# Patient Record
Sex: Female | Born: 1990 | Race: Black or African American | Hispanic: No | Marital: Single | Smoking: Never smoker
Health system: Southern US, Community
[De-identification: ages and names within clinical notes are randomized; demographics above are authoritative.]

---
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
## 2012-01-23 ENCOUNTER — Emergency Department (HOSPITAL_COMMUNITY)
Admission: EM | Admit: 2012-01-23 | Discharge: 2012-01-23 | Disposition: A | Discharge status: Home / Self Care | Payer: Medicaid Other | Attending: Emergency Medicine | Admitting: Emergency Medicine

## 2012-01-23 ENCOUNTER — Encounter (HOSPITAL_COMMUNITY): Payer: Self-pay | Admitting: *Deleted

## 2012-01-23 DIAGNOSIS — M791 Myalgia, unspecified site: Secondary | ICD-10-CM

## 2012-01-23 DIAGNOSIS — R51 Headache: Secondary | ICD-10-CM | POA: Insufficient documentation

## 2012-01-23 DIAGNOSIS — M542 Cervicalgia: Secondary | ICD-10-CM | POA: Insufficient documentation

## 2012-01-23 DIAGNOSIS — IMO0001 Reserved for inherently not codable concepts without codable children: Secondary | ICD-10-CM | POA: Insufficient documentation

## 2012-01-23 MED ORDER — IBUPROFEN 800 MG PO TABS: 800.0000 mg | ORAL_TABLET | Freq: Three times a day (TID) | ORAL | Status: AC | PRN

## 2012-01-23 MED ORDER — DIAZEPAM 5 MG PO TABS: 5.0000 mg | ORAL_TABLET | Freq: Three times a day (TID) | ORAL | Status: AC | PRN

## 2012-01-23 MED ORDER — HYDROCODONE-ACETAMINOPHEN 5-325 MG PO TABS: 1.0000 | ORAL_TABLET | Freq: Four times a day (QID) | ORAL | Status: AC | PRN
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
## 2012-01-24 NOTE — ED Provider Notes (Signed)
Medical screening examination/treatment/procedure(s) were performed by non-physician practitioner and as supervising physician I was immediately available for consultation/collaboration.  Nicholes Stairs, MD 01/24/12 [phone]

## 2016-07-10 ENCOUNTER — Encounter (HOSPITAL_COMMUNITY): Payer: Self-pay | Admitting: Emergency Medicine

## 2016-07-10 ENCOUNTER — Emergency Department (HOSPITAL_COMMUNITY)
Admission: EM | Admit: 2016-07-10 | Discharge: 2016-07-10 | Disposition: A | Discharge status: Home / Self Care | Payer: No Typology Code available for payment source | Attending: Emergency Medicine | Admitting: Emergency Medicine

## 2016-07-10 DIAGNOSIS — Y92410 Unspecified street and highway as the place of occurrence of the external cause: Secondary | ICD-10-CM | POA: Insufficient documentation

## 2016-07-10 DIAGNOSIS — M25512 Pain in left shoulder: Secondary | ICD-10-CM | POA: Diagnosis present

## 2016-07-10 DIAGNOSIS — T148XXA Other injury of unspecified body region, initial encounter: Secondary | ICD-10-CM

## 2016-07-10 DIAGNOSIS — Y999 Unspecified external cause status: Secondary | ICD-10-CM | POA: Insufficient documentation

## 2016-07-10 DIAGNOSIS — R51 Headache: Secondary | ICD-10-CM | POA: Insufficient documentation

## 2016-07-10 DIAGNOSIS — Y9389 Activity, other specified: Secondary | ICD-10-CM | POA: Diagnosis not present

## 2016-07-10 DIAGNOSIS — S39012A Strain of muscle, fascia and tendon of lower back, initial encounter: Secondary | ICD-10-CM | POA: Diagnosis not present

## 2016-07-10 MED ORDER — NAPROXEN 500 MG PO TABS: 500.0000 mg | ORAL_TABLET | Freq: Two times a day (BID) | ORAL | 0 refills | Status: DC

## 2016-07-10 MED ORDER — NAPROXEN 500 MG PO TABS
500.0000 mg | ORAL_TABLET | Freq: Once | ORAL | Status: AC
  Administered 2016-07-10: 500 mg via ORAL
  Filled 2016-07-10: qty 1

## 2016-07-10 MED ORDER — METHOCARBAMOL 500 MG PO TABS: 500.0000 mg | ORAL_TABLET | Freq: Four times a day (QID) | ORAL | 0 refills | Status: DC
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
## 2016-10-11 ENCOUNTER — Encounter (HOSPITAL_COMMUNITY): Payer: Self-pay

## 2016-10-11 ENCOUNTER — Emergency Department (HOSPITAL_COMMUNITY)
Admission: EM | Admit: 2016-10-11 | Discharge: 2016-10-12 | Disposition: A | Discharge status: Home / Self Care | Payer: Self-pay | Attending: Emergency Medicine | Admitting: Emergency Medicine

## 2016-10-11 DIAGNOSIS — R112 Nausea with vomiting, unspecified: Secondary | ICD-10-CM | POA: Insufficient documentation

## 2016-10-11 DIAGNOSIS — R1030 Lower abdominal pain, unspecified: Secondary | ICD-10-CM

## 2016-10-11 LAB — URINE MICROSCOPIC-ADD ON: RBC / HPF: NONE SEEN RBC/hpf (ref 0–5)

## 2016-10-11 LAB — URINALYSIS, ROUTINE W REFLEX MICROSCOPIC
Bilirubin Urine: NEGATIVE
GLUCOSE, UA: NEGATIVE mg/dL
Hgb urine dipstick: NEGATIVE
Ketones, ur: NEGATIVE mg/dL
Nitrite: NEGATIVE
PH: 7 (ref 5.0–8.0)
PROTEIN: NEGATIVE mg/dL
SPECIFIC GRAVITY, URINE: 1.027 (ref 1.005–1.030)

## 2016-10-11 LAB — POC URINE PREG, ED: Preg Test, Ur: NEGATIVE

## 2016-10-11 NOTE — ED Notes (Signed)
Patient giving urine sample at this time 

## 2016-10-11 NOTE — ED Provider Notes (Signed)
[facility]-EMERGENCY DEPT Provider Note   CSN: [id number] Arrival date & time: 10/11/16  2056   By signing my name below, I, [name], attest that this documentation has been prepared under the direction and in the presence of [name], PA-C. Electronically Signed: [name], ED Scribe. 10/11/2016. 11:29 PM.   History   Chief Complaint Chief Complaint  Patient presents with  . Flank Pain    bilateral  . Emesis   The history is provided by the patient. No language interpreter was used.   HPI Comments:  Colleen Keller is a 25 y.o. female who presents to the Emergency Department complaining of constant aching pain with intermittent sharp pain b/l in low back pain with radiation into lower abdomen x 5 hours. Pt complains of associated nausea, vomiting (4 episodes), and yellow vaginal discharge. She denies fever, diarrhea, dysuria, hematuria, vaginal bleeding, vaginal pain, pelvic pain, bowel/bladder incontinence, saddle anesthesia, numbness, or weakness. No alleviating factors noted. Pt is sexually active with 1 female partner, and always uses a condom. Patient does report she has had the same pain on and off for years. She reports she has had US performed that have not shown any abnormalities. She has tried OCP, implanon, and most recently had an IUD placed approximately 2 months ago to help with symptoms. Pt denies history of IV drug use. No h/o cancer.    History reviewed. No pertinent past medical history.  Patient Active Problem List   Diagnosis Date Noted  . OBESITY, UNSPECIFIED 10/12/2008  . CONTACT DERMATITIS&OTHER ECZEMA DUE UNSPEC CAUSE 10/12/2008  . RASH AND OTHER NONSPECIFIC SKIN ERUPTION 09/03/2008  . RHINITIS, ALLERGIC NOS 04/18/2007  . ACNE VULGARIS, FACIAL 04/18/2007    History reviewed. No pertinent surgical history.  OB History    No data available       Home Medications    Prior to Admission medications   Medication Sig Start Date End Date  Taking? Authorizing Provider  doxycycline (VIBRAMYCIN) 100 MG capsule Take 1 capsule (100 mg total) by mouth 2 (two) times daily. 10/12/16 10/26/16  Lona Kettle, PA-C  methocarbamol (ROBAXIN) 500 MG tablet Take 1 tablet (500 mg total) by mouth 4 (four) times daily. Patient not taking: Reported on 10/11/2016 07/10/16   [name], PA-C  naproxen (NAPROSYN) 500 MG tablet Take 1 tablet (500 mg total) by mouth 2 (two) times daily. Patient not taking: Reported on 10/11/2016 07/10/16   [name], PA-C  ondansetron (ZOFRAN ODT) 4 MG disintegrating tablet Take 1 tablet (4 mg total) by mouth every 8 (eight) hours as needed for nausea or vomiting. 10/12/16   Lona Kettle, PA-C    Family History History reviewed. No pertinent family history.  Social History Social History  Substance Use Topics  . Smoking status: Never Smoker  . Smokeless tobacco: Never Used  . Alcohol use Yes     Allergies   Patient has no known allergies.   Review of Systems Review of Systems  Constitutional: Negative for fever.  HENT: Positive for congestion and sore throat.   Eyes: Negative for visual disturbance.  Respiratory: Positive for cough. Negative for shortness of breath.   Cardiovascular: Negative for chest pain.  Gastrointestinal: Positive for abdominal pain ( lower), nausea and vomiting. Negative for diarrhea.  Genitourinary: Positive for vaginal discharge. Negative for dysuria, pelvic pain and vaginal pain.  Musculoskeletal: Positive for back pain.  Skin: Positive for rash.  Neurological: Negative for weakness and numbness.  Physical Exam Updated Vital Signs BP 129/82 (BP Location: Right Arm)   Pulse 72   Temp 98.8 F (37.1 C) (Oral)   Resp 16   Wt 122.5 kg   SpO2 99%   Physical Exam  Constitutional: She appears well-developed and well-nourished. No distress.  HENT:  Head: Normocephalic and atraumatic.  Mouth/Throat: Oropharynx is clear and moist. No oropharyngeal exudate.    Eyes: Conjunctivae and EOM are normal. Right eye exhibits no discharge. Left eye exhibits no discharge. No scleral icterus.  Neck: Normal range of motion. Neck supple.  Cardiovascular: Normal rate, regular rhythm, normal heart sounds and intact distal pulses.   No murmur heard. Pulmonary/Chest: Effort normal and breath sounds normal. No respiratory distress. She has no wheezes. She has no rales. She exhibits no tenderness.  Abdominal: Soft. She exhibits no distension and no mass. There is tenderness in the suprapubic area. There is guarding. There is no rebound. No hernia.  TTP in suprapubic region with mild guarding.   Genitourinary: Vagina normal. Pelvic exam was performed with patient supine. Cervix exhibits motion tenderness and discharge.  Genitourinary Comments: Chaperone present for duration of exam. External anatomy normal - no injury, lesions, masses, or rashes. No bleeding, lesions, masses, or ulcerations in vaginal cavity. Cervix is closed with off white discharge. IUD strings visualized. No friability. Mild CMT and TTP on bimanual exam, no masses palpated.    Musculoskeletal: She exhibits no edema.  Bilateral CVA tenderness. Tender to lumbar spine.   Neurological: She is alert. She has normal strength. She is not disoriented. No sensory deficit. She exhibits normal muscle tone. Coordination normal. GCS eye subscore is 4. GCS verbal subscore is 5. GCS motor subscore is 6.  Skin: Skin is warm and dry. Rash noted. She is not diaphoretic.  Raised vesicular rash  Psychiatric: She has a normal mood and affect. Her behavior is normal.  Nursing note and vitals reviewed.    ED Treatments / Results  DIAGNOSTIC STUDIES:  Oxygen Saturation is 100% on RA, normal by my interpretation.    COORDINATION OF CARE:  11:29 PM Discussed treatment plan with pt at bedside and pt agreed to plan.   Labs (all labs ordered are listed, but only abnormal results are displayed) Labs Reviewed  WET  PREP, GENITAL - Abnormal; Notable for the following:       Result Value   WBC, Wet Prep HPF POC MODERATE (*)    All other components within normal limits  URINALYSIS, ROUTINE W REFLEX MICROSCOPIC (NOT AT [facility]) - Abnormal; Notable for the following:    APPearance CLOUDY (*)    Leukocytes, UA SMALL (*)    All other components within normal limits  URINE MICROSCOPIC-ADD ON - Abnormal; Notable for the following:    Squamous Epithelial / LPF 6-30 (*)    Bacteria, UA FEW (*)    All other components within normal limits  COMPREHENSIVE METABOLIC PANEL - Abnormal; Notable for the following:    Glucose, Bld 100 (*)    ALT 13 (*)    All other components within normal limits  CBC WITH DIFFERENTIAL/PLATELET - Abnormal; Notable for the following:    Hemoglobin 10.7 (*)    HCT 34.4 (*)    All other components within normal limits  LIPASE, BLOOD  POC URINE PREG, ED  GC/CHLAMYDIA PROBE AMP ([place]) NOT AT [facility]    EKG  EKG Interpretation None       Radiology [place] Transvaginal Non-ob  Result Date: 10/12/2016 CLINICAL DATA:  Lower abdominal pain, nausea and vomiting since last night. EXAM: TRANSABDOMINAL AND TRANSVAGINAL ULTRASOUND OF PELVIS DOPPLER ULTRASOUND OF OVARIES TECHNIQUE: Both transabdominal and transvaginal ultrasound examinations of the pelvis were performed. Transabdominal technique was performed for global imaging of the pelvis including uterus, ovaries, adnexal regions, and pelvic cul-de-sac. It was necessary to proceed with endovaginal exam following the transabdominal exam to visualize the ovaries and endometrium. Color and duplex Doppler ultrasound was utilized to evaluate blood flow to the ovaries. COMPARISON:  None. FINDINGS: Uterus Measurements: 9.1 x 4.3 x 5.2 cm. No fibroids or other mass visualized. Endometrium Thickness: 7.5 mm. Echogenic stripe consistent with intrauterine device demonstrated in the lower uterine segment and extending towards the endocervical region. Right  ovary Measurements: 3.7 x 2.2 x 2.4 cm. Normal appearance/no adnexal mass. Left ovary Measurements: 5.4 x 2.9 x 3.9 cm. Normal appearance/no adnexal mass. Pulsed Doppler evaluation of both ovaries demonstrates normal low-resistance arterial and venous waveforms. Other findings Small amount of free fluid in the pelvis. IMPRESSION: Intrauterine device is positioned low in the lower uterine segment extending towards the cervix. Examination is otherwise unremarkable. Electronically Signed   By: Burman Nieves M.D.   On: 10/12/2016 04:18   US Pelvis Complete  Result Date: 10/12/2016 CLINICAL DATA:  Lower abdominal pain, nausea and vomiting since last night. EXAM: TRANSABDOMINAL AND TRANSVAGINAL ULTRASOUND OF PELVIS DOPPLER ULTRASOUND OF OVARIES TECHNIQUE: Both transabdominal and transvaginal ultrasound examinations of the pelvis were performed. Transabdominal technique was performed for global imaging of the pelvis including uterus, ovaries, adnexal regions, and pelvic cul-de-sac. It was necessary to proceed with endovaginal exam following the transabdominal exam to visualize the ovaries and endometrium. Color and duplex Doppler ultrasound was utilized to evaluate blood flow to the ovaries. COMPARISON:  None. FINDINGS: Uterus Measurements: 9.1 x 4.3 x 5.2 cm. No fibroids or other mass visualized. Endometrium Thickness: 7.5 mm. Echogenic stripe consistent with intrauterine device demonstrated in the lower uterine segment and extending towards the endocervical region. Right ovary Measurements: 3.7 x 2.2 x 2.4 cm. Normal appearance/no adnexal mass. Left ovary Measurements: 5.4 x 2.9 x 3.9 cm. Normal appearance/no adnexal mass. Pulsed Doppler evaluation of both ovaries demonstrates normal low-resistance arterial and venous waveforms. Other findings Small amount of free fluid in the pelvis. IMPRESSION: Intrauterine device is positioned low in the lower uterine segment extending towards the cervix. Examination is  otherwise unremarkable. Electronically Signed   By: Burman Nieves M.D.   On: 10/12/2016 04:18   [place] Art/ven Flow Abd Pelv Doppler  Result Date: 10/12/2016 CLINICAL DATA:  Lower abdominal pain, nausea and vomiting since last night. EXAM: TRANSABDOMINAL AND TRANSVAGINAL ULTRASOUND OF PELVIS DOPPLER ULTRASOUND OF OVARIES TECHNIQUE: Both transabdominal and transvaginal ultrasound examinations of the pelvis were performed. Transabdominal technique was performed for global imaging of the pelvis including uterus, ovaries, adnexal regions, and pelvic cul-de-sac. It was necessary to proceed with endovaginal exam following the transabdominal exam to visualize the ovaries and endometrium. Color and duplex Doppler ultrasound was utilized to evaluate blood flow to the ovaries. COMPARISON:  None. FINDINGS: Uterus Measurements: 9.1 x 4.3 x 5.2 cm. No fibroids or other mass visualized. Endometrium Thickness: 7.5 mm. Echogenic stripe consistent with intrauterine device demonstrated in the lower uterine segment and extending towards the endocervical region. Right ovary Measurements: 3.7 x 2.2 x 2.4 cm. Normal appearance/no adnexal mass. Left ovary Measurements: 5.4 x 2.9 x 3.9 cm. Normal appearance/no adnexal mass. Pulsed Doppler evaluation of both ovaries demonstrates normal low-resistance arterial and venous waveforms. Other findings  Small amount of free fluid in the pelvis. IMPRESSION: Intrauterine device is positioned low in the lower uterine segment extending towards the cervix. Examination is otherwise unremarkable. Electronically Signed   By: Burman NievesWilliam  Stevens M.D.   On: 10/12/2016 04:18    Procedures Procedures (including critical care time)  Medications Ordered in ED Medications  ondansetron (ZOFRAN) injection 4 mg (4 mg Intravenous Given 10/12/16 0036)  sodium chloride 0.9 % bolus 500 mL (0 mLs Intravenous Stopped 10/12/16 0100)  morphine 2 MG/ML injection 2 mg (2 mg Intravenous Given 10/12/16 0036)  morphine 2  MG/ML injection 4 mg (4 mg Intravenous Given 10/12/16 0519)  cefTRIAXone (ROCEPHIN) injection 250 mg (250 mg Intramuscular Given 10/12/16 0550)  doxycycline (VIBRA-TABS) tablet 100 mg (100 mg Oral Given 10/12/16 0549)  lidocaine (XYLOCAINE) 1 % (with pres) injection (20 mLs  Given 10/12/16 0550)     Initial Impression / Assessment and Plan / ED Course  I have reviewed the triage vital signs and the nursing notes.  Pertinent labs & imaging results that were available during my care of the patient were reviewed by me and considered in my medical decision making (see chart for details).  Clinical Course as of Oct 14 37  Mon Oct 12, 2016  0500 US reviewed.   [AM]    Clinical Course User Index [AM] Lona KettleAshley Laurel Meyer, PA-C    Patient presents to ED with complaint of low back pain with radiation into lower abdomen and vaginal discharge onset tonight. Patient is afebrile and non-toxic appearing in NAD. VSS. No midline spinal tenderness. TTP of suprapubic region with guarding. White discharge noted on pelvic, IUD string visualized; mild CMT and tenderness on bimanual exam. IVF, pain medication, and anti-emetics initiated.   Pregnancy test negative - doubt ectopic. Lipase nml, no epigastric tenderness - doubt pancreatitis. CMP grossly normal. LFTs and bili nml, no RUQ tenderness - doubt acute cholecystitis or cholangitis. U/A has some leukocytes and bacteria; however, 6-30 squamous epithelial, pt is asx - ?contaminated specimen. Wet prep remarkable for WBCs. Abdomen is soft without peritoneal signs or rigidity with +BS, at this time low suspicion for obstruction/perforation. Pt is afebrile, no leukocytosis, and no localized TTP of RLQ, at this time low suspicion for appendicitis. Given physical exam findings will order pelvic U/S to r/o pelvic pathology.    US nml, no ovarian torsion. Given IUD placement and physical exam findings with TTP on bimanual exam will treat for possible cervicitis. Discussed  results and plan with pt. First dose ABX given in ED. Rx doxycycline. Follow up with OBGYN this week for re-evaluation. Return precautions given. Pt voiced understanding and is agreeable.   Final Clinical Impressions(s) / ED Diagnoses   Final diagnoses:  Lower abdominal pain    New Prescriptions Discharge Medication List as of 10/12/2016  5:53 AM    START taking these medications   Details  doxycycline (VIBRAMYCIN) 100 MG capsule Take 1 capsule (100 mg total) by mouth 2 (two) times daily., Starting Mon 10/12/2016, Until Mon 10/26/2016, Print    ondansetron (ZOFRAN ODT) 4 MG disintegrating tablet Take 1 tablet (4 mg total) by mouth every 8 (eight) hours as needed for nausea or vomiting., Starting Mon 10/12/2016, Print      I personally performed the services described in this documentation, which was scribed in my presence. The recorded information has been reviewed and is accurate.    Lona KettleAshley Laurel Meyer, PA-C 10/14/16 [id number]    Devoria AlbeIva Knapp, MD 10/16/16 2300

## 2016-10-11 NOTE — ED Triage Notes (Signed)
Pt c/o bilateral flank pain and N/V starting at 1830. Pt denies diarrhea or urinary symptoms. A&Ox4.  Ambulatory. Pt states that she does have an IUD in that was placed in September.

## 2016-10-12 ENCOUNTER — Emergency Department (HOSPITAL_COMMUNITY): Payer: Self-pay

## 2016-10-12 LAB — COMPREHENSIVE METABOLIC PANEL
ALBUMIN: 4.1 g/dL (ref 3.5–5.0)
ALK PHOS: 56 U/L (ref 38–126)
ALT: 13 U/L — AB (ref 14–54)
AST: 18 U/L (ref 15–41)
Anion gap: 8 (ref 5–15)
BUN: 11 mg/dL (ref 6–20)
CALCIUM: 9.4 mg/dL (ref 8.9–10.3)
CHLORIDE: 105 mmol/L (ref 101–111)
CO2: 26 mmol/L (ref 22–32)
CREATININE: 0.9 mg/dL (ref 0.44–1.00)
GFR calc Af Amer: >60 mL/min (ref >60)
GFR calc non Af Amer: >60 mL/min (ref >60)
GLUCOSE: 100 mg/dL — AB (ref 65–99)
Potassium: 4.2 mmol/L (ref 3.5–5.1)
SODIUM: 139 mmol/L (ref 135–145)
Total Bilirubin: 0.5 mg/dL (ref 0.3–1.2)
Total Protein: 7.7 g/dL (ref 6.5–8.1)

## 2016-10-12 LAB — CBC WITH DIFFERENTIAL/PLATELET
BASOS ABS: 0 10*3/uL (ref 0.0–0.1)
BASOS PCT: 0 %
Eosinophils Absolute: 0 10*3/uL (ref 0.0–0.7)
Eosinophils Relative: 0 %
HEMATOCRIT: 34.4 % — AB (ref 36.0–46.0)
HEMOGLOBIN: 10.7 g/dL — AB (ref 12.0–15.0)
LYMPHS ABS: 1.7 10*3/uL (ref 0.7–4.0)
LYMPHS PCT: 21 %
MCH: 26.1 pg (ref 26.0–34.0)
MCHC: 31.1 g/dL (ref 30.0–36.0)
MCV: 83.9 fL (ref 78.0–100.0)
MONOS PCT: 4 %
Monocytes Absolute: 0.3 10*3/uL (ref 0.1–1.0)
Neutro Abs: 6.2 10*3/uL (ref 1.7–7.7)
Neutrophils Relative %: 75 %
Platelets: 290 10*3/uL (ref 150–400)
RBC: 4.1 MIL/uL (ref 3.87–5.11)
RDW: 15.2 % (ref 11.5–15.5)
WBC: 8.4 10*3/uL (ref 4.0–10.5)

## 2016-10-12 LAB — WET PREP, GENITAL
CLUE CELLS WET PREP: NONE SEEN
Sperm: NONE SEEN
TRICH WET PREP: NONE SEEN
YEAST WET PREP: NONE SEEN

## 2016-10-12 LAB — GC/CHLAMYDIA PROBE AMP (~~LOC~~) NOT AT ARMC
CHLAMYDIA, DNA PROBE: NEGATIVE
Neisseria Gonorrhea: NEGATIVE

## 2016-10-12 LAB — LIPASE, BLOOD: Lipase: 22 U/L (ref 11–51)

## 2016-10-12 MED ORDER — SODIUM CHLORIDE 0.9 % IV BOLUS (SEPSIS)
500.0000 mL | Freq: Once | INTRAVENOUS | Status: AC
  Administered 2016-10-12: 500 mL via INTRAVENOUS

## 2016-10-12 MED ORDER — DOXYCYCLINE HYCLATE 100 MG PO TABS
100.0000 mg | ORAL_TABLET | Freq: Once | ORAL | Status: AC
  Administered 2016-10-12: 100 mg via ORAL
  Filled 2016-10-12: qty 1

## 2016-10-12 MED ORDER — MORPHINE SULFATE (PF) 2 MG/ML IV SOLN
2.0000 mg | Freq: Once | INTRAVENOUS | Status: AC
  Administered 2016-10-12: 2 mg via INTRAVENOUS
  Filled 2016-10-12: qty 1

## 2016-10-12 MED ORDER — CEFTRIAXONE SODIUM 250 MG IJ SOLR
250.0000 mg | Freq: Once | INTRAMUSCULAR | Status: AC
  Administered 2016-10-12: 250 mg via INTRAMUSCULAR
  Filled 2016-10-12: qty 250

## 2016-10-12 MED ORDER — ONDANSETRON 4 MG PO TBDP: 4.0000 mg | ORAL_TABLET | Freq: Three times a day (TID) | ORAL | 0 refills | Status: DC | PRN

## 2016-10-12 MED ORDER — ONDANSETRON HCL 4 MG/2ML IJ SOLN
4.0000 mg | Freq: Once | INTRAMUSCULAR | Status: AC
  Administered 2016-10-12: 4 mg via INTRAVENOUS
  Filled 2016-10-12: qty 2

## 2016-10-12 MED ORDER — LIDOCAINE HCL 1 % IJ SOLN
INTRAMUSCULAR | Status: AC
  Administered 2016-10-12: 20 mL
  Filled 2016-10-12: qty 20

## 2016-10-12 MED ORDER — DOXYCYCLINE HYCLATE 100 MG PO CAPS: 100.0000 mg | ORAL_CAPSULE | Freq: Two times a day (BID) | ORAL | 0 refills | Status: AC

## 2016-10-12 MED ORDER — MORPHINE SULFATE (PF) 2 MG/ML IV SOLN
4.0000 mg | Freq: Once | INTRAVENOUS | Status: AC
  Administered 2016-10-12: 4 mg via INTRAVENOUS
  Filled 2016-10-12: qty 2

## 2016-10-12 NOTE — ED Notes (Signed)
Patient given water per po fluid challenge, instructed to take sips. Patient tolerating well thus far.

## 2016-10-12 NOTE — Discharge Instructions (Signed)
Read the information below.  Your labs were remarkable for white blood cells on your pelvic exam. Given your tenderness on exam you will be treated for a possible pelvic infection. Please take antibiotics as directed. If develop rash or difficulty breathing please discontinue and come to the ED.  You can take tylenol or motrin for pain relief.  Please follow up with your OBGYN this week for re-evaluation.  Use the prescribed medication as directed.  Please discuss all new medications with your pharmacist.   You may return to the Emergency Department at any time for worsening condition or any new symptoms that concern you. Return to ED if develop fever, inability to keep food/fluids down, localization of abdominal pain to right lower abdomen, blood in stool, or any other new/concerning symptoms to you.

## 2019-08-23 ENCOUNTER — Encounter: Payer: Self-pay | Admitting: Certified Nurse Midwife

## 2019-08-23 ENCOUNTER — Other Ambulatory Visit: Payer: Self-pay

## 2020-04-11 ENCOUNTER — Emergency Department (HOSPITAL_COMMUNITY): Payer: Self-pay

## 2020-04-11 ENCOUNTER — Encounter (HOSPITAL_COMMUNITY): Payer: Self-pay | Admitting: Emergency Medicine

## 2020-04-11 ENCOUNTER — Emergency Department (HOSPITAL_COMMUNITY)
Admission: EM | Admit: 2020-04-11 | Discharge: 2020-04-11 | Disposition: A | Discharge status: Home / Self Care | Payer: Self-pay | Attending: Emergency Medicine | Admitting: Emergency Medicine

## 2020-04-11 ENCOUNTER — Other Ambulatory Visit: Payer: Self-pay

## 2020-04-11 DIAGNOSIS — R52 Pain, unspecified: Secondary | ICD-10-CM

## 2020-04-11 DIAGNOSIS — M79671 Pain in right foot: Secondary | ICD-10-CM | POA: Insufficient documentation

## 2020-04-11 NOTE — ED Provider Notes (Signed)
[facility] DEPT Provider Note   CSN: [id number] Arrival date & time: 04/11/20  1756     History Chief Complaint  Patient presents with  . Foot Pain    Colleen Keller is a 29 y.o. female.  Colleen Keller is a 29 y.o. female who is otherwise heathy, presents for evaluation of right foot pain. Patient reports pain has been presents few days but got worse last night. She states pain is primarily present in her heel and is worst when she steps down on it. Pain radiates up  Her foot but does not extend into the ankle or calf. No pain over achilles. She denies any trauma or injury to the foot. No swelling, redness or wounds. 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        History reviewed. No pertinent past medical history.  Patient Active Problem List   Diagnosis Date Noted  . OBESITY, UNSPECIFIED 10/12/2008  . CONTACT DERMATITIS&OTHER ECZEMA DUE UNSPEC CAUSE 10/12/2008  . RASH AND OTHER NONSPECIFIC SKIN ERUPTION 09/03/2008  . RHINITIS, ALLERGIC NOS 04/18/2007  . ACNE VULGARIS, FACIAL 04/18/2007    History reviewed. No pertinent surgical history.   OB History   No obstetric history on file.     No family history on file.  Social History   Tobacco Use  . Smoking status: Never Smoker  . Smokeless tobacco: Never Used  Substance Use Topics  . Alcohol use: Yes  . Drug use: Not on file    Home Medications Prior to Admission medications   Medication Sig Start Date End Date Taking? Authorizing Provider  methocarbamol (ROBAXIN) 500 MG tablet Take 1 tablet (500 mg total) by mouth 4 (four) times daily. Patient not taking: Reported on 10/11/2016 07/10/16   Carlisle Cater, PA-C  naproxen (NAPROSYN) 500 MG tablet Take 1 tablet (500 mg total) by mouth 2 (two) times daily. Patient not taking: Reported on 10/11/2016 07/10/16   Carlisle Cater, PA-C  ondansetron (ZOFRAN ODT) 4 MG  disintegrating tablet Take 1 tablet (4 mg total) by mouth every 8 (eight) hours as needed for nausea or vomiting. 10/12/16   Frederica Kuster, PA-C    Allergies    Patient has no known allergies.  Review of Systems   Review of Systems  Constitutional: Negative for chills and fever.  Musculoskeletal: Positive for arthralgias. Negative for joint swelling.  Skin: Negative for color change and rash.  Neurological: Negative for weakness and numbness.    Physical Exam Updated Vital Signs BP (!) 146/79 (BP Location: Left Arm)   Pulse 86   Temp 98.2 F (36.8 C) (Oral)   Resp 16   LMP 03/28/2020   SpO2 100%   Physical Exam Vitals and nursing note reviewed.  Constitutional:      General: She is not in acute distress.    Appearance: Normal appearance. She is well-developed. She is not ill-appearing or diaphoretic.  HENT:     Head: Normocephalic and atraumatic.  Eyes:     General:        Right eye: No discharge.        Left eye: No discharge.  Pulmonary:     Effort: Pulmonary effort is normal. No respiratory distress.  Musculoskeletal:     Comments: Some tenderness on palpation over the heel pad, no significant deformity noted, no erythema or swelling, no wounds or discoloration.  2+ DP and TP  pulses with good cap refill, normal range of motion of the foot and ankle, no tenderness over the ankle or Achilles.  Normal sensation.  5/5 strength.  Skin:    General: Skin is warm and dry.  Neurological:     Mental Status: She is alert.     Coordination: Coordination normal.  Psychiatric:        Behavior: Behavior normal.     ED Results / Procedures / Treatments   Labs (all labs ordered are listed, but only abnormal results are displayed) Labs Reviewed - No data to display  EKG None  Radiology DG Foot Complete Right  Result Date: 04/11/2020 CLINICAL DATA:  Right foot pain for the past 2 weeks. EXAM: RIGHT FOOT COMPLETE - 3+ VIEW COMPARISON:  None. FINDINGS: Sclerosis and spur  formation involving the navicular. There is also corticated fragmentation of the navicular. No acute fracture or dislocation seen. 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 No acute abnormality. Electronically Signed   By: Beckie Salts M.D.   On: 04/11/2020 18:37    Procedures Procedures (including critical care time)  Medications Ordered in ED Medications - No data to display  ED Course  I have reviewed the triage vital signs and the nursing notes.  Pertinent labs & imaging results that were available during my care of the patient were reviewed by me and considered in my medical decision making (see chart for details).    MDM Rules/Calculators/A&P                     29 year old female presents with right foot pain, primarily located over the heel that is worse with weightbearing, no deformity, no erythema or signs of infection, neurovascularly intact.  Concerning for potential plantar fasciitis, or heel pad atrophy potentially.  X-ray shows previous navicular fracture with some AVN, but pain is not located over the navicular.  Doubt this is contributing to patient's pain.  We will have her treat with NSAIDs, encourage patient to try and stay off of her feet as much as possible, practice provided.  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  I have encouraged her to follow-up with podiatry.  Patient expresses understanding and agreement.  Discharged home in good condition.  Final Clinical Impression(s) / ED Diagnoses Final diagnoses:  Pain  Foot pain, right    Rx / DC Orders ED Discharge Orders    None       Legrand Rams 04/12/20 Joan Flores    Arby Barrette, MD 04/12/20 [phone]

## 2020-04-11 NOTE — Discharge Instructions (Signed)
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  May be related to plantar fasciitis or heel pad atrophy.  Try and stay off of your feet, you can roll the foot on a frozen water bottle to help with discomfort, take ibuprofen 800 mg every 8 hours, you can take Tylenol in addition to this for continued pain.  Please call to schedule follow-up appointment with podiatry for further evaluation.

## 2020-04-11 NOTE — ED Triage Notes (Signed)
Patient reports pain shooting in right foot since last night. Denies injury. Reports recently starting to exercise.

## 2021-03-26 ENCOUNTER — Other Ambulatory Visit: Payer: Self-pay | Admitting: Internal Medicine

## 2021-03-26 DIAGNOSIS — N6310 Unspecified lump in the right breast, unspecified quadrant: Secondary | ICD-10-CM

## 2021-04-03 ENCOUNTER — Ambulatory Visit (INDEPENDENT_AMBULATORY_CARE_PROVIDER_SITE_OTHER): Payer: 59

## 2021-04-03 ENCOUNTER — Ambulatory Visit: Payer: 59

## 2021-04-03 ENCOUNTER — Other Ambulatory Visit: Payer: Self-pay

## 2021-04-03 ENCOUNTER — Telehealth: Payer: Self-pay | Admitting: Podiatry

## 2021-04-03 ENCOUNTER — Encounter: Payer: Self-pay | Admitting: Podiatry

## 2021-04-03 ENCOUNTER — Ambulatory Visit (INDEPENDENT_AMBULATORY_CARE_PROVIDER_SITE_OTHER): Payer: 59 | Admitting: Podiatry

## 2021-04-03 DIAGNOSIS — S92251S Displaced fracture of navicular [scaphoid] of right foot, sequela: Secondary | ICD-10-CM

## 2021-04-03 DIAGNOSIS — M2141 Flat foot [pes planus] (acquired), right foot: Secondary | ICD-10-CM | POA: Diagnosis not present

## 2021-04-03 DIAGNOSIS — M722 Plantar fascial fibromatosis: Secondary | ICD-10-CM

## 2021-04-03 DIAGNOSIS — M2142 Flat foot [pes planus] (acquired), left foot: Secondary | ICD-10-CM | POA: Diagnosis not present

## 2021-04-03 DIAGNOSIS — M928 Other specified juvenile osteochondrosis: Secondary | ICD-10-CM

## 2021-04-03 MED ORDER — MELOXICAM 15 MG PO TABS: 15.0000 mg | ORAL_TABLET | Freq: Every day | ORAL | 3 refills | Status: DC

## 2021-04-03 NOTE — Telephone Encounter (Signed)
Patient called our office today stating she seen Dr. Lilian Kapur this morning and he prescribed her some medication. She would like to know if we can send it [facility]

## 2021-04-03 NOTE — Progress Notes (Signed)
  Subjective:  Patient ID: Colleen Keller, female    DOB: [birth year],  MRN: [id number]  Chief Complaint  Patient presents with  . Foot Pain    Right foot pain in the heel     30 y.o. female presents with the above complaint. History confirmed with patient.  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  She reports right plantar lateral heel pain that gets worse as the day goes on.  She also reports intermittent swelling and pain in the front of the ankle.  She injured her ankle when she was a child she thinks about 48 to 37 years old and says that she was told that she "chipped a bone in her ankle" and needed to wear a brace for little bit.  Objective:  Physical Exam: warm, good capillary refill, no trophic changes or ulcerative lesions, normal DP and PT pulses and normal sensory exam. Left Foot:   Right Foot: Sharp pain on plantar fascial insertion of the lateral calcaneal tubercle, she has mild edema over the talonavicular joint   Radiographs: X-ray of the right foot: Previous navicular fracture/osteochondral injury with collapse of the lateral portion of the navicular and a large dorsal bone fragment still present she has pes planus deformity Assessment:   1. 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 Closed displaced fracture of navicular bone of right foot, sequela      Plan:  Patient was evaluated and treated and all questions answered.  Reviewed radiographs with the patient and discussed clinical findings with her.  I think the pain she is having is plantar fasciitis.  She also has what appears to be a previous navicular fracture/osteochondral injury of the lateral navicular with bony collapse and a large bone fragment that remains.  I think this likely is beginning to become arthritic.  I am ordering an MRI to evaluate this and guide potential surgical planning if necessary.   Discussed the etiology  and treatment options for plantar fasciitis including stretching, formal physical therapy, supportive shoegears such as a running shoe or sneaker, pre fabricated orthoses, injection therapy, and oral medications. We also discussed the role of surgical treatment of this for patients who do not improve after exhausting non-surgical treatment options.   Plantar Fasciitis -XR reviewed with patient -Educated patient on stretching and icing of the affected limb -Injection delivered to the plantar fascia of the right foot. -Rx for meloxicam. Educated on use, risks and benefits of the medication   After sterile prep with povidone-iodine solution and alcohol, the right heel was injected with 0.5cc 2% xylocaine plain, 0.5cc 0.5% marcaine plain, 5mg  triamcinolone acetonide, and 2mg  dexamethasone was injected along the plantar fascia at the insertion on the plantar calcaneus. The patient tolerated the procedure well without complication.    Return in about 1 month (around 05/03/2021) for recheck plantar fasciitis, after MRI to review.

## 2021-04-03 NOTE — Patient Instructions (Signed)

## 2021-04-03 NOTE — Telephone Encounter (Signed)
Patient called our office today stating she seen Dr. McDonald this morning and he prescribed her some medication. She would like to know if we can send it [facility] [address]   

## 2021-04-12 ENCOUNTER — Other Ambulatory Visit: Payer: Self-pay

## 2021-04-12 ENCOUNTER — Ambulatory Visit
Admission: RE | Admit: 2021-04-12 | Discharge: 2021-04-12 | Disposition: A | Discharge status: Home / Self Care | Payer: 59 | Source: Ambulatory Visit | Attending: Podiatry | Admitting: Podiatry

## 2021-04-12 DIAGNOSIS — S92251S Displaced fracture of navicular [scaphoid] of right foot, sequela: Secondary | ICD-10-CM

## 2021-04-12 DIAGNOSIS — M928 Other specified juvenile osteochondrosis: Secondary | ICD-10-CM

## 2021-04-22 ENCOUNTER — Other Ambulatory Visit: Payer: Self-pay

## 2021-04-22 ENCOUNTER — Ambulatory Visit (INDEPENDENT_AMBULATORY_CARE_PROVIDER_SITE_OTHER): Payer: 59 | Admitting: Podiatry

## 2021-04-22 DIAGNOSIS — M928 Other specified juvenile osteochondrosis: Secondary | ICD-10-CM | POA: Diagnosis not present

## 2021-04-22 DIAGNOSIS — S92251S Displaced fracture of navicular [scaphoid] of right foot, sequela: Secondary | ICD-10-CM | POA: Diagnosis not present

## 2021-04-22 DIAGNOSIS — M722 Plantar fascial fibromatosis: Secondary | ICD-10-CM

## 2021-04-26 ENCOUNTER — Encounter: Payer: Self-pay | Admitting: Podiatry

## 2021-04-26 NOTE — Progress Notes (Signed)
  Subjective:  Patient ID: Colleen Keller, female    DOB: [birth year],  MRN: [id number]  Chief Complaint  Patient presents with  . Plantar Fasciitis     recheck plantar fasciitis, after MRI to review    30 y.o. female returns with the above complaint. History confirmed with patient.  Overall she is doing much better.  The injection helped quite a bit.  She completed the MRI.  Objective:  Physical Exam: warm, good capillary refill, no trophic changes or ulcerative lesions, normal DP and PT pulses and normal sensory exam. She has no pain on palpation today   Radiographs: X-ray of the right foot: Previous navicular fracture/osteochondral injury with collapse of the lateral portion of the navicular and a large dorsal bone fragment still present she has pes planus deformity  Study Result  Narrative & Impression  CLINICAL DATA:  Chronic right ankle pain  EXAM: MRI OF THE RIGHT ANKLE WITHOUT CONTRAST  TECHNIQUE: Multiplanar, multisequence MR imaging of the ankle was performed. No intravenous contrast was administered.  COMPARISON:  X-ray 04/03/2021  FINDINGS: TENDONS  Peroneal: Intact peroneus longus and peroneus brevis tendons.  Posteromedial: Intact tibialis posterior, flexor hallucis longus and flexor digitorum longus tendons.  Anterior: Intact tibialis anterior, extensor hallucis longus and extensor digitorum longus tendons.  Achilles: Intact.  Plantar Fascia: Intact.  LIGAMENTS  Lateral: The anterior and posterior tibiofibular ligaments are intact. The anterior and posterior talofibular ligaments are intact. Intact calcaneofibular ligament.  Medial: Deltoid ligament and spring ligament complex intact.  CARTILAGE  Ankle Joint: No joint effusion or chondral defect.  Subtalar Joints/Sinus Tarsi: No joint effusion or chondral defect. Preservation of the anatomic fat within the sinus tarsi.  Bones: Abnormal comma-shaped appearance of the  navicular bone with collapse of the lateral aspect of the navicular with heterogeneous T2 signal and diffusely low signal on T1 (series 6, image 14). Bone is partially fragmented more dorsally. Remaining osseous structures appear intact. No evidence of acute fracture. No dislocation. Pes planus alignment. No suspicious marrow replacing bone lesion.  Other: Nonspecific subcutaneous edema about the ankle. No organized fluid collection.  IMPRESSION: 1. Abnormal comma-shaped appearance of the navicular bone with sclerosis and partial collapse/fragmentation. Findings are compatible with osteonecrosis. 2. Pes planus alignment. 3. Intact tendons and ligaments.   Electronically Signed   By: Duanne Guess D.O.   On: 04/13/2021 11:25    Assessment:   1. 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 Closed displaced fracture of navicular bone of right foot, sequela      Plan:  Patient was evaluated and treated and all questions answered.  I reviewed the MRI findings with her.  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  Discussed with her she may need fusions at some point but currently is not that bothersome for her.  We will revisit this in the future if it becomes symptomatic    Plantar Fasciitis  Overall doing much better she should return if it returns and gets worse otherwise continue stretching and therapeutic exercises  Return if symptoms worsen or fail to improve.

## 2021-04-28 ENCOUNTER — Ambulatory Visit
Admission: RE | Admit: 2021-04-28 | Discharge: 2021-04-28 | Disposition: A | Discharge status: Home / Self Care | Payer: 59 | Source: Ambulatory Visit | Attending: Internal Medicine | Admitting: Internal Medicine

## 2021-04-28 ENCOUNTER — Other Ambulatory Visit: Payer: Self-pay

## 2021-04-28 DIAGNOSIS — N6310 Unspecified lump in the right breast, unspecified quadrant: Secondary | ICD-10-CM

## 2021-10-15 IMAGING — MR MR ANKLE*R* W/O CM
5 series · 38 of 40 positions shown · non-contrast
Comparison: X-ray 04/03/2021

CLINICAL DATA: Chronic right ankle pain

EXAM:
MRI OF THE RIGHT ANKLE WITHOUT CONTRAST
TECHNIQUE: Multiplanar, multisequence MR imaging of the ankle was performed. No
intravenous contrast was administered.

[Series 4: T2 fat-sat · axial · 3.0mm · 0.50mm/px · z∈[-101,+39]mm · 9 of 37 slices shown (1 of 2)]
[im 1/37]
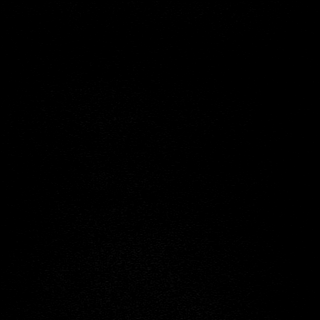
[im 5/37]
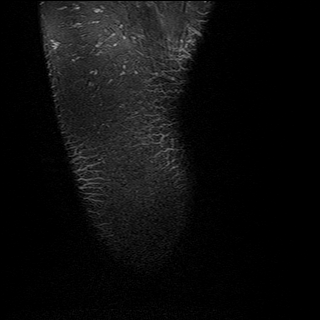
[im 10/37]
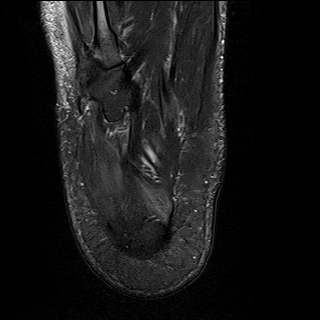
[im 14/37]
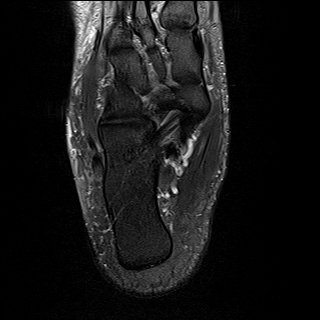
[im 19/37]
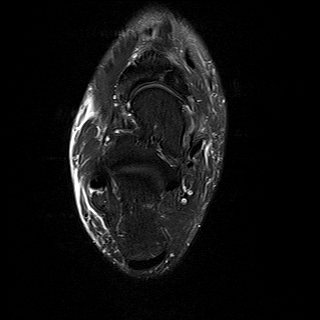
[im 23/37]
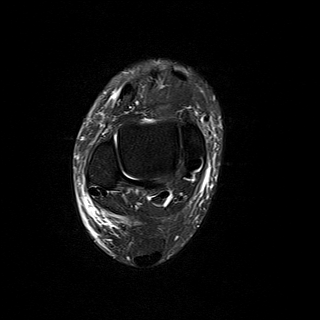
[im 28/37]
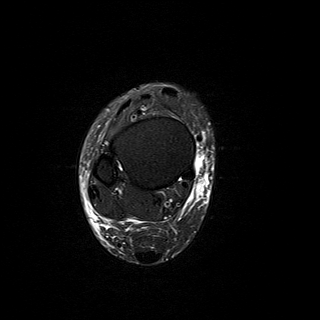
[im 32/37]
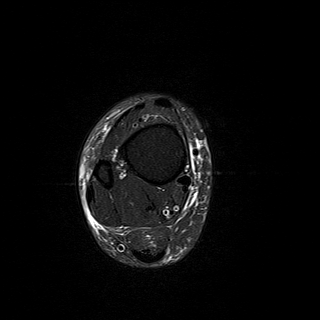
[im 37/37]
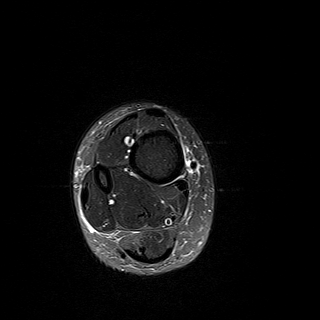

[Series 5: PD fat-sat · axial · 3.0mm · 0.50mm/px · z∈[-101,+39]mm · 9 of 37 slices shown]
[im 1/37]
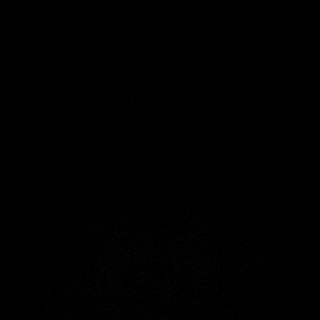
[im 5/37]
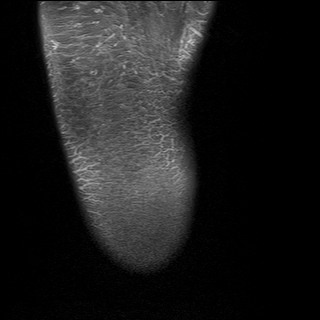
[im 10/37]
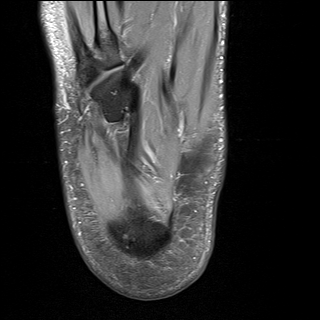
[im 14/37]
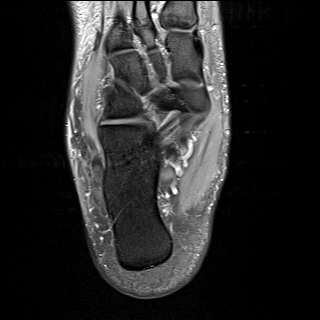
[im 19/37]
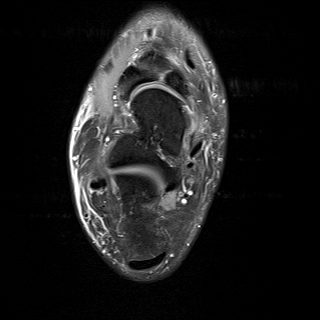
[im 23/37]
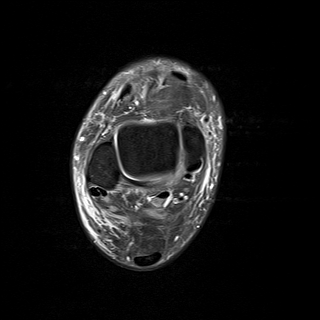
[im 28/37]
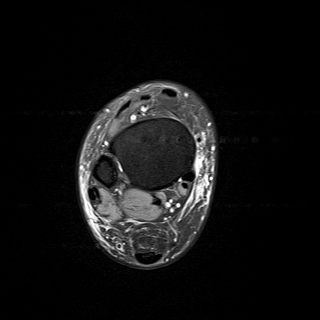
[im 32/37]
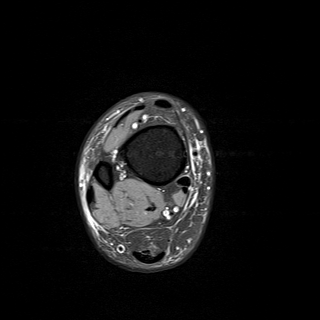
[im 37/37]
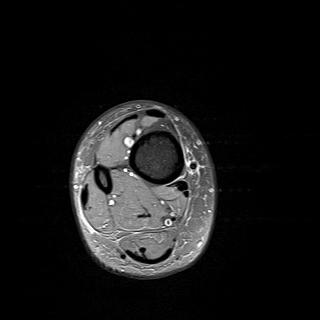

[Series 6: T1 · sagittal · 4.0mm · 0.56mm/px · 6 of 22 slices shown]
[im 1/22]
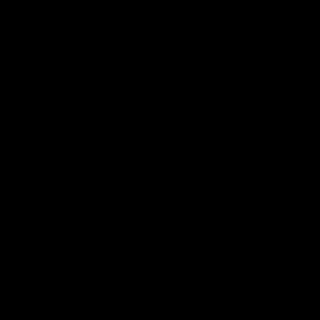
[im 5/22]
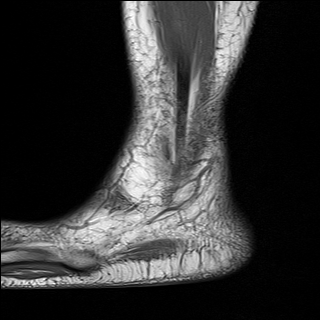
[im 9/22]
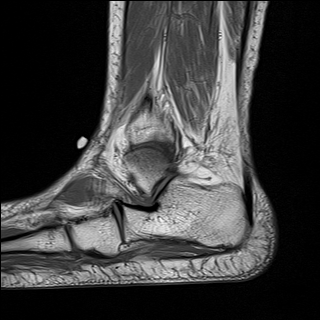
[im 13/22]
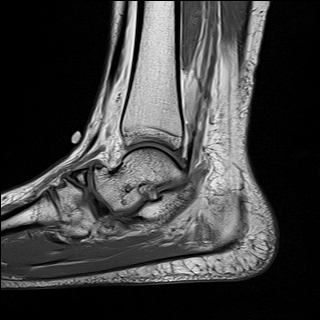
[im 17/22]
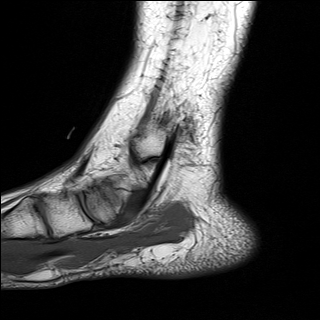
[im 22/22]
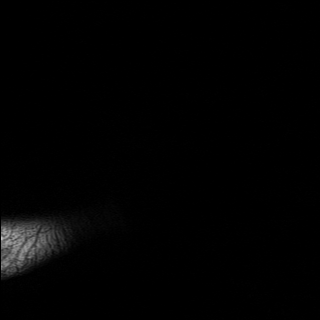

[Series 7: STIR · sagittal · 4.0mm · 0.35mm/px · 6 of 22 slices shown]
[im 1/22]
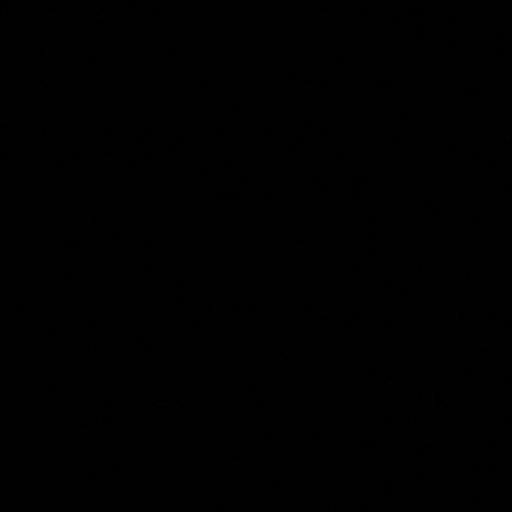
[im 5/22]
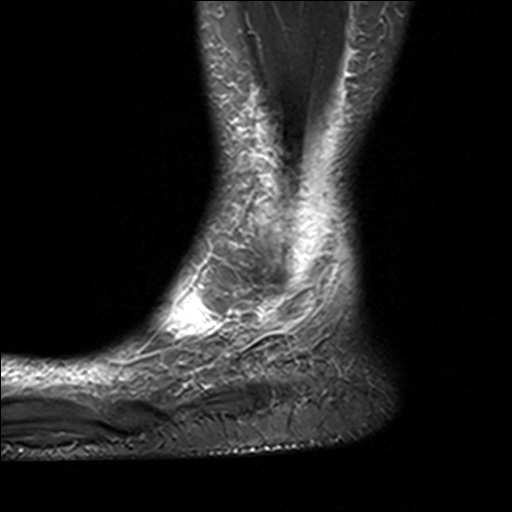
[im 9/22]
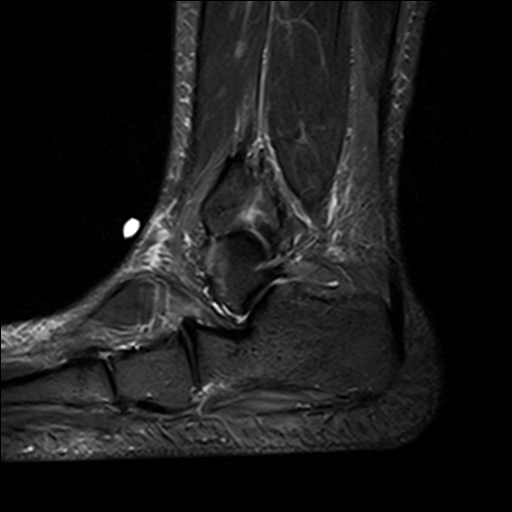
[im 13/22]
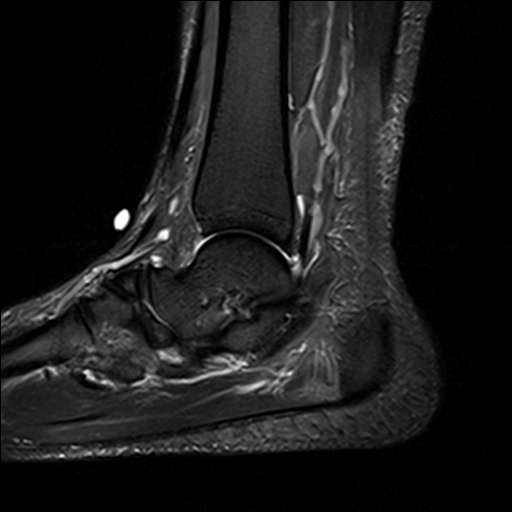
[im 17/22]
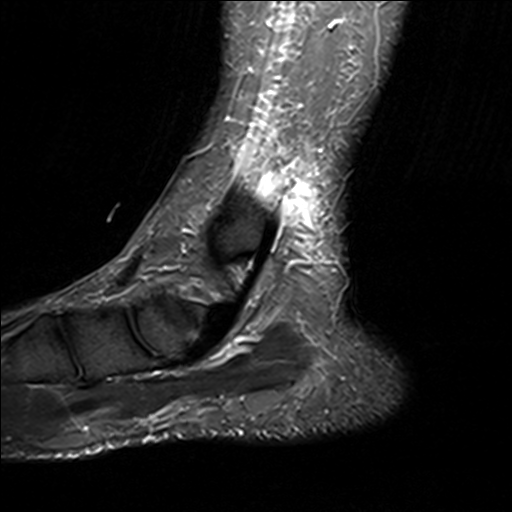
[im 22/22]
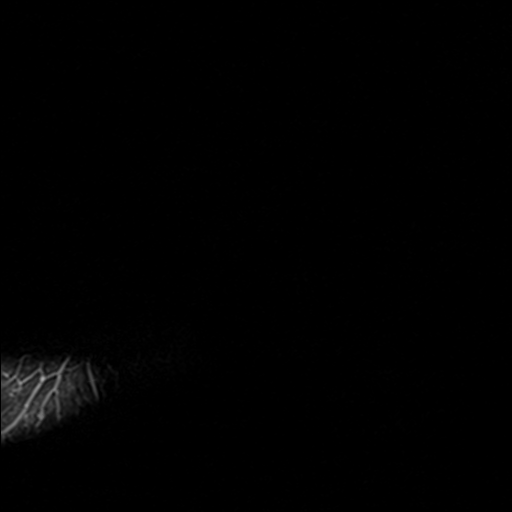

[Series 8: T2 fat-sat · coronal · 3.0mm · 0.50mm/px · 8 of 39 slices shown (2 of 2)]
[im 1/39]
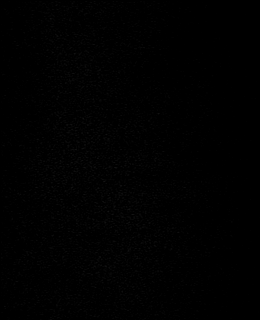
[im 5/39]
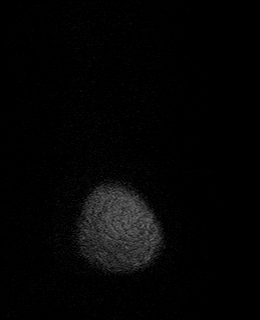
[im 13/39]
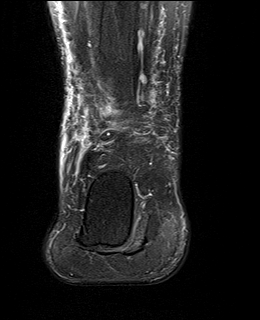
[im 17/39]
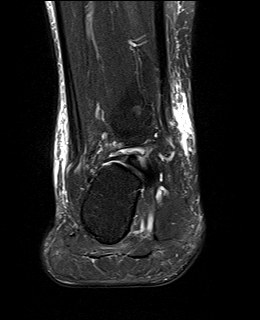
[im 22/39]
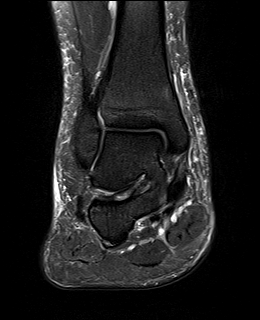
[im 26/39]
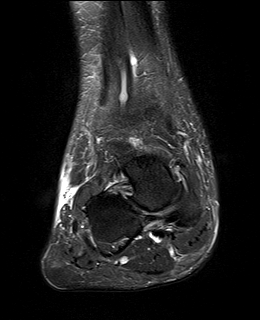
[im 34/39]
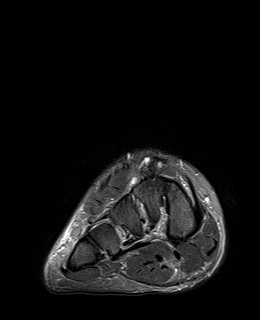
[im 39/39]
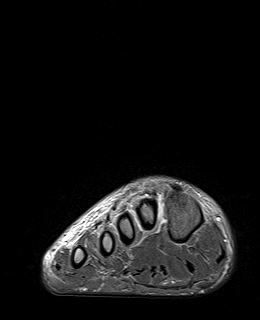

[38 of 40 positions shown; findings below may reference images not displayed]

FINDINGS: TENDONS

Peroneal: Intact peroneus longus and peroneus brevis tendons.

Posteromedial: Intact tibialis posterior, flexor hallucis longus and
flexor digitorum longus tendons.

Anterior: Intact tibialis anterior, extensor hallucis longus and
extensor digitorum longus tendons.

Achilles: Intact.

Plantar Fascia: Intact.

LIGAMENTS

Lateral: The anterior and posterior tibiofibular ligaments are
intact. The anterior and posterior talofibular ligaments are intact.
Intact calcaneofibular ligament.

Medial: Deltoid ligament and spring ligament complex intact.

CARTILAGE

Ankle Joint: No joint effusion or chondral defect.

Subtalar Joints/Sinus Tarsi: No joint effusion or chondral defect.
Preservation of the anatomic fat within the sinus tarsi.

Bones: Abnormal comma-shaped appearance of the navicular bone with
collapse of the lateral aspect of the navicular with heterogeneous
T2 signal and diffusely low signal on T1 (series 6, image 14). Bone
is partially fragmented more dorsally. Remaining osseous structures
appear intact. No evidence of acute fracture. No dislocation. Pes
planus alignment. No suspicious marrow replacing bone lesion.

Other: Nonspecific subcutaneous edema about the ankle. No organized
fluid collection.
IMPRESSION: 1. Abnormal comma-shaped appearance of the navicular bone with
sclerosis and partial collapse/fragmentation. Findings are
compatible with osteonecrosis.
2. Pes planus alignment.
3. Intact tendons and ligaments.

## 2022-09-16 ENCOUNTER — Other Ambulatory Visit: Payer: Self-pay | Admitting: Internal Medicine

## 2022-09-16 ENCOUNTER — Ambulatory Visit
Admission: RE | Admit: 2022-09-16 | Discharge: 2022-09-16 | Disposition: A | Discharge status: Home / Self Care | Payer: Commercial Managed Care - HMO | Source: Ambulatory Visit | Attending: Internal Medicine | Admitting: Internal Medicine

## 2022-09-16 DIAGNOSIS — R1031 Right lower quadrant pain: Secondary | ICD-10-CM

## 2022-09-16 MED ORDER — IOPAMIDOL (ISOVUE-370) INJECTION 76%
80.0000 mL | Freq: Once | INTRAVENOUS | Status: AC | PRN
  Administered 2022-09-16: 80 mL via INTRAVENOUS

## 2022-11-10 ENCOUNTER — Encounter: Payer: Self-pay | Admitting: Obstetrics and Gynecology

## 2022-11-10 ENCOUNTER — Ambulatory Visit: Payer: Commercial Managed Care - HMO | Admitting: Obstetrics and Gynecology

## 2022-11-10 VITALS — BP 138/82 | HR 87

## 2022-11-10 DIAGNOSIS — N946 Dysmenorrhea, unspecified: Secondary | ICD-10-CM | POA: Diagnosis not present

## 2022-11-10 DIAGNOSIS — N939 Abnormal uterine and vaginal bleeding, unspecified: Secondary | ICD-10-CM

## 2022-11-10 DIAGNOSIS — R102 Pelvic and perineal pain: Secondary | ICD-10-CM | POA: Diagnosis not present

## 2022-11-10 MED ORDER — CYCLOBENZAPRINE HCL 10 MG PO TABS: 10.0000 mg | ORAL_TABLET | Freq: Two times a day (BID) | ORAL | 1 refills | Status: DC | PRN

## 2022-11-10 NOTE — Progress Notes (Signed)
NEW GYNECOLOGY PATIENT Patient name: Colleen Keller MRN [id number]  Date of birth: September 07, 1991 Chief Complaint:   Pelvic Pain     History:  JESSICAH Keller is a 31 y.o. No obstetric history on file. being seen today for pelvic pain, heavy vaginal bleeding and newly diagnosed .    Initially seen by [facility] PCP in severe pain, referral to their OBGYN and imaging.  Initial referral to [facility] to have surgery to remove fibroids but never heard back from [facility], last year. In the interim pain has increased and having a lot of bleeding. Referred to several OBGYN practices to .  Seen by Dr. Cherly Hensen - delay in getting records. [facility] is out of network and doesn't want to drive that far  Years of heavy bleeding but the pain is new and non-cyclcic. Completed pelvic MRI elsewhere, does not have report.  Started workup with PCP but has been having issues with being seen so symptoms have worsened and at some point had referrals sent to several OBGYN in and out of [facility] to see who would be able to see her sooner.   Sharp pain in lower back and wraps around to the front. Sharp pain is random. Pain all day = soreness.  Improved: had been taking pain medication and now has gastritis and currently not taking anything. Treated for gastritis, BV, UTI  Prescribed tramadol  but didn't take it because she still had pain and felt dizzy. Pantoprazole, zofran, amox, fluconazole, metronidazole, norethindrone acetate, ketoconazaole cream for d/c from belly button (same as d/c from vagina and it has resolved) and has completed all  No pain with intercourse, no dysuria, dyschezia since September  Cramping with periods previous - years    Like the IUD - coopper made it regular but heavy and painful. Never got lng-IUD due to failed insertion - reports that "path" was very tortuous so no one could get in. EMB felt similar to attempted IUD insertino.   Pelvic Pain The patient's primary symptoms include pelvic pain.      Review of Systems  Genitourinary:  Positive for pelvic pain.      Gynecologic History Patient's last menstrual period was 11/08/2022. Last Pap: 2023. Result was normal with other HR HPV EMB 2023: disordered proliferative endometrium   Obstetric History OB History  No obstetric history on file.    History reviewed. No pertinent past medical history.  History reviewed. No pertinent surgical history.  Current Outpatient Medications on File Prior to Visit  Medication Sig Dispense Refill   ferrous sulfate 325 (65 FE) MG EC tablet Take by mouth. (Patient not taking: Reported on 11/10/2022)     meloxicam (MOBIC) 15 MG tablet Take 1 tablet (15 mg total) by mouth daily. (Patient not taking: Reported on 11/10/2022) 30 tablet 3   methocarbamol (ROBAXIN) 500 MG tablet Take 1 tablet (500 mg total) by mouth 4 (four) times daily. (Patient not taking: Reported on 10/11/2016) 20 tablet 0   naproxen (NAPROSYN) 500 MG tablet Take 1 tablet (500 mg total) by mouth 2 (two) times daily. (Patient not taking: Reported on 10/11/2016) 20 tablet 0   ofloxacin (OCUFLOX) 0.3 % ophthalmic solution Place 1 drop into the right eye 3 (three) times daily. (Patient not taking: Reported on 11/10/2022)     ondansetron (ZOFRAN ODT) 4 MG disintegrating tablet Take 1 tablet (4 mg total) by mouth every 8 (eight) hours as needed for nausea or vomiting. (Patient not taking: Reported on 11/10/2022) 20 tablet 0  No current facility-administered medications on file prior to visit.    No Known Allergies  Social History:  reports that she has never smoked. She has never used smokeless tobacco. She reports current alcohol use.  History reviewed. No pertinent family history.  The following portions of the patient's history were reviewed and updated as appropriate: allergies, current medications, past family history, past medical history, past social history, past surgical history and problem list.  Review of  Systems Pertinent items noted in HPI and remainder of comprehensive ROS otherwise negative.  Physical Exam:  BP 138/82   Pulse 87   LMP 11/08/2022  Physical Exam Vitals and nursing note reviewed.  Constitutional:      Appearance: Normal appearance.  Pulmonary:     Effort: Pulmonary effort is normal.  Neurological:     General: No focal deficit present.     Mental Status: She is alert and oriented to person, place, and time.  Psychiatric:        Mood and Affect: Mood normal.        Behavior: Behavior normal.        Thought Content: Thought content normal.        Judgment: Judgment normal.      Assessment and Plan:   1. Pelvic pain Trial muscle relaxer prn for pain. Comprehensive exam next visit.  - cyclobenzaprine (FLEXERIL) 10 MG tablet; Take 1 tablet (10 mg total) by mouth 2 (two) times daily as needed for muscle spasms. Start with once at night  Dispense: 30 tablet; Refill: 1  2. Dysmenorrhea Does not want hormones at this time. Will look into orilissa. Consider IUD but hesitant. Have her request outside MRI images and reports  3. Abnormal uterine bleeding (AUB) Reviewed outside labs. Possible metabolic d/o/PCOS. Will need to review outside workup  Intends to stay with [facility]- does not want to drive to [facility] and we are in network. Will try to consolidate all information. Unclear if and what surgical intervention needed. Briefly discussed impact of surgery on infertility.    Routine preventative health maintenance measures emphasized. Please refer to After Visit Summary for other counseling recommendations.   Follow-up: No follow-ups on file.      Lorriane Shire, MD Obstetrician & Gynecologist, [facility] [employer], [facility]

## 2022-11-10 NOTE — Patient Instructions (Addendum)
Request pelvic MRI images and a copy of the results   Medication to look into: orilissa  Starting a muscle relaxer for the pain - start with one pill at night  Come back for an exam to assess the muscles

## 2022-12-17 ENCOUNTER — Ambulatory Visit: Payer: Commercial Managed Care - HMO | Admitting: Obstetrics and Gynecology

## 2022-12-25 DIAGNOSIS — N80109 Endometriosis of ovary, unspecified side, unspecified depth: Secondary | ICD-10-CM | POA: Diagnosis not present

## 2023-01-11 DIAGNOSIS — E559 Vitamin D deficiency, unspecified: Secondary | ICD-10-CM | POA: Diagnosis not present

## 2023-01-11 DIAGNOSIS — Z3162 Encounter for fertility preservation counseling: Secondary | ICD-10-CM | POA: Diagnosis not present

## 2023-01-11 DIAGNOSIS — Z31430 Encounter of female for testing for genetic disease carrier status for procreative management: Secondary | ICD-10-CM | POA: Diagnosis not present

## 2023-01-11 DIAGNOSIS — Z319 Encounter for procreative management, unspecified: Secondary | ICD-10-CM | POA: Diagnosis not present

## 2023-01-11 DIAGNOSIS — N8302 Follicular cyst of left ovary: Secondary | ICD-10-CM | POA: Diagnosis not present

## 2023-02-04 ENCOUNTER — Emergency Department (HOSPITAL_COMMUNITY)
Admission: EM | Admit: 2023-02-04 | Discharge: 2023-02-05 | Disposition: A | Discharge status: Home / Self Care | Payer: BC Managed Care – PPO | Attending: Emergency Medicine | Admitting: Emergency Medicine

## 2023-02-04 ENCOUNTER — Other Ambulatory Visit: Payer: Self-pay

## 2023-02-04 ENCOUNTER — Emergency Department (HOSPITAL_COMMUNITY): Payer: BC Managed Care – PPO

## 2023-02-04 DIAGNOSIS — S00512A Abrasion of oral cavity, initial encounter: Secondary | ICD-10-CM

## 2023-02-04 DIAGNOSIS — T07XXXA Unspecified multiple injuries, initial encounter: Secondary | ICD-10-CM

## 2023-02-04 DIAGNOSIS — R000 Tachycardia, unspecified: Secondary | ICD-10-CM | POA: Diagnosis not present

## 2023-02-04 DIAGNOSIS — M542 Cervicalgia: Secondary | ICD-10-CM | POA: Diagnosis not present

## 2023-02-04 DIAGNOSIS — R1084 Generalized abdominal pain: Secondary | ICD-10-CM | POA: Insufficient documentation

## 2023-02-04 DIAGNOSIS — Y92410 Unspecified street and highway as the place of occurrence of the external cause: Secondary | ICD-10-CM | POA: Insufficient documentation

## 2023-02-04 DIAGNOSIS — R930 Abnormal findings on diagnostic imaging of skull and head, not elsewhere classified: Secondary | ICD-10-CM | POA: Insufficient documentation

## 2023-02-04 DIAGNOSIS — M25512 Pain in left shoulder: Secondary | ICD-10-CM | POA: Insufficient documentation

## 2023-02-04 DIAGNOSIS — S161XXA Strain of muscle, fascia and tendon at neck level, initial encounter: Secondary | ICD-10-CM | POA: Insufficient documentation

## 2023-02-04 DIAGNOSIS — S00511A Abrasion of lip, initial encounter: Secondary | ICD-10-CM | POA: Diagnosis not present

## 2023-02-04 DIAGNOSIS — R9431 Abnormal electrocardiogram [ECG] [EKG]: Secondary | ICD-10-CM | POA: Diagnosis not present

## 2023-02-04 DIAGNOSIS — M25532 Pain in left wrist: Secondary | ICD-10-CM | POA: Diagnosis not present

## 2023-02-04 DIAGNOSIS — S199XXA Unspecified injury of neck, initial encounter: Secondary | ICD-10-CM | POA: Diagnosis not present

## 2023-02-04 DIAGNOSIS — S63502A Unspecified sprain of left wrist, initial encounter: Secondary | ICD-10-CM | POA: Diagnosis not present

## 2023-02-04 DIAGNOSIS — S63501A Unspecified sprain of right wrist, initial encounter: Secondary | ICD-10-CM

## 2023-02-04 DIAGNOSIS — S40812A Abrasion of left upper arm, initial encounter: Secondary | ICD-10-CM | POA: Insufficient documentation

## 2023-02-04 DIAGNOSIS — S40811A Abrasion of right upper arm, initial encounter: Secondary | ICD-10-CM | POA: Insufficient documentation

## 2023-02-04 DIAGNOSIS — Z041 Encounter for examination and observation following transport accident: Secondary | ICD-10-CM | POA: Diagnosis not present

## 2023-02-04 DIAGNOSIS — M7989 Other specified soft tissue disorders: Secondary | ICD-10-CM | POA: Diagnosis not present

## 2023-02-04 DIAGNOSIS — R600 Localized edema: Secondary | ICD-10-CM | POA: Diagnosis not present

## 2023-02-04 LAB — CBC WITH DIFFERENTIAL/PLATELET
Abs Immature Granulocytes: 0.01 10*3/uL (ref 0.00–0.07)
Basophils Absolute: 0 10*3/uL (ref 0.0–0.1)
Basophils Relative: 1 %
Eosinophils Absolute: 0.1 10*3/uL (ref 0.0–0.5)
Eosinophils Relative: 2 %
HCT: 30.4 % — ABNORMAL LOW (ref 36.0–46.0)
Hemoglobin: 8.7 g/dL — ABNORMAL LOW (ref 12.0–15.0)
Immature Granulocytes: 0 %
Lymphocytes Relative: 32 %
Lymphs Abs: 2.7 10*3/uL (ref 0.7–4.0)
MCH: 22.1 pg — ABNORMAL LOW (ref 26.0–34.0)
MCHC: 28.6 g/dL — ABNORMAL LOW (ref 30.0–36.0)
MCV: 77.4 fL — ABNORMAL LOW (ref 80.0–100.0)
Monocytes Absolute: 0.6 10*3/uL (ref 0.1–1.0)
Monocytes Relative: 7 %
Neutro Abs: 5 10*3/uL (ref 1.7–7.7)
Neutrophils Relative %: 58 %
Platelets: 325 10*3/uL (ref 150–400)
RBC: 3.93 MIL/uL (ref 3.87–5.11)
RDW: 17.4 % — ABNORMAL HIGH (ref 11.5–15.5)
WBC: 8.4 10*3/uL (ref 4.0–10.5)
nRBC: 0 % (ref 0.0–0.2)

## 2023-02-04 LAB — URINALYSIS, ROUTINE W REFLEX MICROSCOPIC
Bilirubin Urine: NEGATIVE
Glucose, UA: NEGATIVE mg/dL
Ketones, ur: NEGATIVE mg/dL
Nitrite: NEGATIVE
Protein, ur: NEGATIVE mg/dL
Specific Gravity, Urine: 1.017 (ref 1.005–1.030)
pH: 6 (ref 5.0–8.0)

## 2023-02-04 LAB — I-STAT BETA HCG BLOOD, ED (MC, WL, AP ONLY): I-stat hCG, quantitative: <5 m[IU]/mL (ref <5)

## 2023-02-04 MED ORDER — MORPHINE SULFATE (PF) 4 MG/ML IV SOLN
4.0000 mg | Freq: Once | INTRAVENOUS | Status: AC
  Administered 2023-02-04: 4 mg via INTRAVENOUS
  Filled 2023-02-04: qty 1

## 2023-02-04 MED ORDER — ONDANSETRON HCL 4 MG/2ML IJ SOLN
4.0000 mg | Freq: Once | INTRAMUSCULAR | Status: AC
  Administered 2023-02-04: 4 mg via INTRAVENOUS
  Filled 2023-02-04: qty 2
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
## 2023-02-05 ENCOUNTER — Encounter (HOSPITAL_COMMUNITY): Payer: Self-pay

## 2023-02-05 ENCOUNTER — Emergency Department (HOSPITAL_COMMUNITY): Payer: BC Managed Care – PPO

## 2023-02-05 DIAGNOSIS — Z041 Encounter for examination and observation following transport accident: Secondary | ICD-10-CM | POA: Diagnosis not present

## 2023-02-05 LAB — COMPREHENSIVE METABOLIC PANEL
ALT: 12 U/L (ref 0–44)
AST: 17 U/L (ref 15–41)
Albumin: 3.9 g/dL (ref 3.5–5.0)
Alkaline Phosphatase: 42 U/L (ref 38–126)
Anion gap: 8 (ref 5–15)
BUN: 8 mg/dL (ref 6–20)
CO2: 21 mmol/L — ABNORMAL LOW (ref 22–32)
Calcium: 8.9 mg/dL (ref 8.9–10.3)
Chloride: 107 mmol/L (ref 98–111)
Creatinine, Ser: 1.13 mg/dL — ABNORMAL HIGH (ref 0.44–1.00)
GFR, Estimated: >60 mL/min (ref >60)
Glucose, Bld: 86 mg/dL (ref 70–99)
Potassium: 3.4 mmol/L — ABNORMAL LOW (ref 3.5–5.1)
Sodium: 136 mmol/L (ref 135–145)
Total Bilirubin: 0.4 mg/dL (ref 0.3–1.2)
Total Protein: 8.3 g/dL — ABNORMAL HIGH (ref 6.5–8.1)

## 2023-02-05 MED ORDER — IBUPROFEN 600 MG PO TABS: 600.0000 mg | ORAL_TABLET | Freq: Three times a day (TID) | ORAL | 0 refills | Status: AC | PRN

## 2023-02-05 MED ORDER — NAPROXEN 500 MG PO TABS: 500.0000 mg | ORAL_TABLET | Freq: Two times a day (BID) | ORAL | 0 refills | Status: DC

## 2023-02-05 MED ORDER — IOHEXOL 300 MG/ML  SOLN
100.0000 mL | Freq: Once | INTRAMUSCULAR | Status: AC | PRN
  Administered 2023-02-05: 100 mL via INTRAVENOUS

## 2023-02-05 MED ORDER — SODIUM CHLORIDE (PF) 0.9 % IJ SOLN
INTRAMUSCULAR | Status: AC
  Filled 2023-02-05: qty 50

## 2023-02-05 MED ORDER — METHOCARBAMOL 500 MG PO TABS: 500.0000 mg | ORAL_TABLET | Freq: Two times a day (BID) | ORAL | 0 refills | Status: AC

## 2023-02-05 MED ORDER — CYCLOBENZAPRINE HCL 10 MG PO TABS: 10.0000 mg | ORAL_TABLET | Freq: Three times a day (TID) | ORAL | 0 refills | Status: AC | PRN
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
## 2023-02-12 DIAGNOSIS — S6391XA Sprain of unspecified part of right wrist and hand, initial encounter: Secondary | ICD-10-CM | POA: Diagnosis not present

## 2023-02-12 DIAGNOSIS — S66911A Strain of unspecified muscle, fascia and tendon at wrist and hand level, right hand, initial encounter: Secondary | ICD-10-CM | POA: Diagnosis not present

## 2023-02-12 DIAGNOSIS — S161XXA Strain of muscle, fascia and tendon at neck level, initial encounter: Secondary | ICD-10-CM | POA: Diagnosis not present

## 2023-02-25 DIAGNOSIS — M79644 Pain in right finger(s): Secondary | ICD-10-CM | POA: Diagnosis not present

## 2023-02-25 DIAGNOSIS — M25512 Pain in left shoulder: Secondary | ICD-10-CM | POA: Diagnosis not present

## 2023-02-25 DIAGNOSIS — M25511 Pain in right shoulder: Secondary | ICD-10-CM | POA: Diagnosis not present

## 2023-02-25 DIAGNOSIS — M542 Cervicalgia: Secondary | ICD-10-CM | POA: Diagnosis not present

## 2023-03-01 DIAGNOSIS — M25511 Pain in right shoulder: Secondary | ICD-10-CM | POA: Diagnosis not present

## 2023-03-01 DIAGNOSIS — M79641 Pain in right hand: Secondary | ICD-10-CM | POA: Diagnosis not present

## 2023-03-01 DIAGNOSIS — M542 Cervicalgia: Secondary | ICD-10-CM | POA: Diagnosis not present

## 2023-03-01 DIAGNOSIS — M25512 Pain in left shoulder: Secondary | ICD-10-CM | POA: Diagnosis not present

## 2023-03-04 DIAGNOSIS — M542 Cervicalgia: Secondary | ICD-10-CM | POA: Diagnosis not present

## 2023-03-04 DIAGNOSIS — M25511 Pain in right shoulder: Secondary | ICD-10-CM | POA: Diagnosis not present

## 2023-03-04 DIAGNOSIS — M25512 Pain in left shoulder: Secondary | ICD-10-CM | POA: Diagnosis not present

## 2023-03-04 DIAGNOSIS — M79641 Pain in right hand: Secondary | ICD-10-CM | POA: Diagnosis not present

## 2023-03-11 DIAGNOSIS — M25511 Pain in right shoulder: Secondary | ICD-10-CM | POA: Diagnosis not present

## 2023-03-11 DIAGNOSIS — M542 Cervicalgia: Secondary | ICD-10-CM | POA: Diagnosis not present

## 2023-03-11 DIAGNOSIS — M25512 Pain in left shoulder: Secondary | ICD-10-CM | POA: Diagnosis not present

## 2023-03-11 DIAGNOSIS — M79641 Pain in right hand: Secondary | ICD-10-CM | POA: Diagnosis not present

## 2023-03-16 DIAGNOSIS — M542 Cervicalgia: Secondary | ICD-10-CM | POA: Diagnosis not present

## 2023-03-16 DIAGNOSIS — M25512 Pain in left shoulder: Secondary | ICD-10-CM | POA: Diagnosis not present

## 2023-03-16 DIAGNOSIS — M25511 Pain in right shoulder: Secondary | ICD-10-CM | POA: Diagnosis not present

## 2023-03-16 DIAGNOSIS — M79641 Pain in right hand: Secondary | ICD-10-CM | POA: Diagnosis not present

## 2023-03-17 DIAGNOSIS — Z3162 Encounter for fertility preservation counseling: Secondary | ICD-10-CM | POA: Diagnosis not present

## 2023-03-17 DIAGNOSIS — N7011 Chronic salpingitis: Secondary | ICD-10-CM | POA: Diagnosis not present

## 2023-03-17 DIAGNOSIS — N80101 Endometriosis of right ovary, unspecified depth: Secondary | ICD-10-CM | POA: Diagnosis not present

## 2023-03-18 DIAGNOSIS — M25511 Pain in right shoulder: Secondary | ICD-10-CM | POA: Diagnosis not present

## 2023-03-18 DIAGNOSIS — M79641 Pain in right hand: Secondary | ICD-10-CM | POA: Diagnosis not present

## 2023-03-18 DIAGNOSIS — M542 Cervicalgia: Secondary | ICD-10-CM | POA: Diagnosis not present

## 2023-03-18 DIAGNOSIS — M25512 Pain in left shoulder: Secondary | ICD-10-CM | POA: Diagnosis not present

## 2023-03-22 DIAGNOSIS — M79641 Pain in right hand: Secondary | ICD-10-CM | POA: Diagnosis not present

## 2023-03-22 DIAGNOSIS — M542 Cervicalgia: Secondary | ICD-10-CM | POA: Diagnosis not present

## 2023-03-22 DIAGNOSIS — M25511 Pain in right shoulder: Secondary | ICD-10-CM | POA: Diagnosis not present

## 2023-03-22 DIAGNOSIS — M25512 Pain in left shoulder: Secondary | ICD-10-CM | POA: Diagnosis not present

## 2023-03-24 DIAGNOSIS — M25512 Pain in left shoulder: Secondary | ICD-10-CM | POA: Diagnosis not present

## 2023-03-24 DIAGNOSIS — M79641 Pain in right hand: Secondary | ICD-10-CM | POA: Diagnosis not present

## 2023-03-24 DIAGNOSIS — M542 Cervicalgia: Secondary | ICD-10-CM | POA: Diagnosis not present

## 2023-03-24 DIAGNOSIS — M25511 Pain in right shoulder: Secondary | ICD-10-CM | POA: Diagnosis not present

## 2023-03-29 DIAGNOSIS — M79641 Pain in right hand: Secondary | ICD-10-CM | POA: Diagnosis not present

## 2023-03-29 DIAGNOSIS — M25512 Pain in left shoulder: Secondary | ICD-10-CM | POA: Diagnosis not present

## 2023-03-29 DIAGNOSIS — M542 Cervicalgia: Secondary | ICD-10-CM | POA: Diagnosis not present

## 2023-03-29 DIAGNOSIS — M25511 Pain in right shoulder: Secondary | ICD-10-CM | POA: Diagnosis not present

## 2023-03-31 DIAGNOSIS — M79641 Pain in right hand: Secondary | ICD-10-CM | POA: Diagnosis not present

## 2023-03-31 DIAGNOSIS — M542 Cervicalgia: Secondary | ICD-10-CM | POA: Diagnosis not present

## 2023-03-31 DIAGNOSIS — M25511 Pain in right shoulder: Secondary | ICD-10-CM | POA: Diagnosis not present

## 2023-03-31 DIAGNOSIS — M25512 Pain in left shoulder: Secondary | ICD-10-CM | POA: Diagnosis not present

## 2023-04-06 DIAGNOSIS — M25512 Pain in left shoulder: Secondary | ICD-10-CM | POA: Diagnosis not present

## 2023-04-06 DIAGNOSIS — M25511 Pain in right shoulder: Secondary | ICD-10-CM | POA: Diagnosis not present

## 2023-04-06 DIAGNOSIS — M542 Cervicalgia: Secondary | ICD-10-CM | POA: Diagnosis not present

## 2023-04-06 DIAGNOSIS — M79641 Pain in right hand: Secondary | ICD-10-CM | POA: Diagnosis not present

## 2023-04-07 DIAGNOSIS — M79641 Pain in right hand: Secondary | ICD-10-CM | POA: Diagnosis not present

## 2023-04-07 DIAGNOSIS — J3089 Other allergic rhinitis: Secondary | ICD-10-CM | POA: Diagnosis not present

## 2023-04-07 DIAGNOSIS — M25512 Pain in left shoulder: Secondary | ICD-10-CM | POA: Diagnosis not present

## 2023-04-07 DIAGNOSIS — Z0000 Encounter for general adult medical examination without abnormal findings: Secondary | ICD-10-CM | POA: Diagnosis not present

## 2023-04-07 DIAGNOSIS — Z0183 Encounter for blood typing: Secondary | ICD-10-CM | POA: Diagnosis not present

## 2023-04-07 DIAGNOSIS — N83299 Other ovarian cyst, unspecified side: Secondary | ICD-10-CM | POA: Diagnosis not present

## 2023-04-07 DIAGNOSIS — Z13220 Encounter for screening for lipoid disorders: Secondary | ICD-10-CM | POA: Diagnosis not present

## 2023-04-07 DIAGNOSIS — D509 Iron deficiency anemia, unspecified: Secondary | ICD-10-CM | POA: Diagnosis not present

## 2023-04-07 DIAGNOSIS — Z23 Encounter for immunization: Secondary | ICD-10-CM | POA: Diagnosis not present

## 2023-04-07 DIAGNOSIS — M542 Cervicalgia: Secondary | ICD-10-CM | POA: Diagnosis not present

## 2023-04-07 DIAGNOSIS — M25511 Pain in right shoulder: Secondary | ICD-10-CM | POA: Diagnosis not present

## 2023-04-13 DIAGNOSIS — M25512 Pain in left shoulder: Secondary | ICD-10-CM | POA: Diagnosis not present

## 2023-04-13 DIAGNOSIS — M25511 Pain in right shoulder: Secondary | ICD-10-CM | POA: Diagnosis not present

## 2023-04-13 DIAGNOSIS — M79641 Pain in right hand: Secondary | ICD-10-CM | POA: Diagnosis not present

## 2023-04-13 DIAGNOSIS — M542 Cervicalgia: Secondary | ICD-10-CM | POA: Diagnosis not present

## 2023-04-15 DIAGNOSIS — M25511 Pain in right shoulder: Secondary | ICD-10-CM | POA: Diagnosis not present

## 2023-04-15 DIAGNOSIS — M542 Cervicalgia: Secondary | ICD-10-CM | POA: Diagnosis not present

## 2023-04-15 DIAGNOSIS — M79641 Pain in right hand: Secondary | ICD-10-CM | POA: Diagnosis not present

## 2023-04-15 DIAGNOSIS — M25512 Pain in left shoulder: Secondary | ICD-10-CM | POA: Diagnosis not present

## 2023-04-21 DIAGNOSIS — M25511 Pain in right shoulder: Secondary | ICD-10-CM | POA: Diagnosis not present

## 2023-04-21 DIAGNOSIS — M79641 Pain in right hand: Secondary | ICD-10-CM | POA: Diagnosis not present

## 2023-04-21 DIAGNOSIS — M542 Cervicalgia: Secondary | ICD-10-CM | POA: Diagnosis not present

## 2023-04-21 DIAGNOSIS — M25512 Pain in left shoulder: Secondary | ICD-10-CM | POA: Diagnosis not present

## 2023-04-22 DIAGNOSIS — M25511 Pain in right shoulder: Secondary | ICD-10-CM | POA: Diagnosis not present

## 2023-04-22 DIAGNOSIS — M25512 Pain in left shoulder: Secondary | ICD-10-CM | POA: Diagnosis not present

## 2023-04-22 DIAGNOSIS — M542 Cervicalgia: Secondary | ICD-10-CM | POA: Diagnosis not present

## 2023-04-22 DIAGNOSIS — M79641 Pain in right hand: Secondary | ICD-10-CM | POA: Diagnosis not present

## 2023-04-27 DIAGNOSIS — M25511 Pain in right shoulder: Secondary | ICD-10-CM | POA: Diagnosis not present

## 2023-04-27 DIAGNOSIS — M542 Cervicalgia: Secondary | ICD-10-CM | POA: Diagnosis not present

## 2023-04-27 DIAGNOSIS — M79641 Pain in right hand: Secondary | ICD-10-CM | POA: Diagnosis not present

## 2023-04-27 DIAGNOSIS — M25512 Pain in left shoulder: Secondary | ICD-10-CM | POA: Diagnosis not present

## 2023-05-05 DIAGNOSIS — M542 Cervicalgia: Secondary | ICD-10-CM | POA: Diagnosis not present

## 2023-05-05 DIAGNOSIS — M25511 Pain in right shoulder: Secondary | ICD-10-CM | POA: Diagnosis not present

## 2023-05-05 DIAGNOSIS — M25512 Pain in left shoulder: Secondary | ICD-10-CM | POA: Diagnosis not present

## 2023-05-05 DIAGNOSIS — M79641 Pain in right hand: Secondary | ICD-10-CM | POA: Diagnosis not present

## 2023-05-07 DIAGNOSIS — M542 Cervicalgia: Secondary | ICD-10-CM | POA: Diagnosis not present

## 2023-05-07 DIAGNOSIS — M25511 Pain in right shoulder: Secondary | ICD-10-CM | POA: Diagnosis not present

## 2023-05-07 DIAGNOSIS — M79641 Pain in right hand: Secondary | ICD-10-CM | POA: Diagnosis not present

## 2023-05-07 DIAGNOSIS — M25512 Pain in left shoulder: Secondary | ICD-10-CM | POA: Diagnosis not present

## 2023-05-12 DIAGNOSIS — M542 Cervicalgia: Secondary | ICD-10-CM | POA: Diagnosis not present

## 2023-05-12 DIAGNOSIS — M79641 Pain in right hand: Secondary | ICD-10-CM | POA: Diagnosis not present

## 2023-05-12 DIAGNOSIS — M25511 Pain in right shoulder: Secondary | ICD-10-CM | POA: Diagnosis not present

## 2023-05-12 DIAGNOSIS — M25512 Pain in left shoulder: Secondary | ICD-10-CM | POA: Diagnosis not present

## 2023-05-14 DIAGNOSIS — M25512 Pain in left shoulder: Secondary | ICD-10-CM | POA: Diagnosis not present

## 2023-05-14 DIAGNOSIS — M25511 Pain in right shoulder: Secondary | ICD-10-CM | POA: Diagnosis not present

## 2023-05-14 DIAGNOSIS — M79641 Pain in right hand: Secondary | ICD-10-CM | POA: Diagnosis not present

## 2023-05-14 DIAGNOSIS — M542 Cervicalgia: Secondary | ICD-10-CM | POA: Diagnosis not present

## 2023-05-17 DIAGNOSIS — M79641 Pain in right hand: Secondary | ICD-10-CM | POA: Diagnosis not present

## 2023-05-17 DIAGNOSIS — M542 Cervicalgia: Secondary | ICD-10-CM | POA: Diagnosis not present

## 2023-05-17 DIAGNOSIS — M25511 Pain in right shoulder: Secondary | ICD-10-CM | POA: Diagnosis not present

## 2023-05-17 DIAGNOSIS — M25512 Pain in left shoulder: Secondary | ICD-10-CM | POA: Diagnosis not present

## 2023-05-19 DIAGNOSIS — M542 Cervicalgia: Secondary | ICD-10-CM | POA: Diagnosis not present

## 2023-05-19 DIAGNOSIS — M25511 Pain in right shoulder: Secondary | ICD-10-CM | POA: Diagnosis not present

## 2023-05-19 DIAGNOSIS — M79641 Pain in right hand: Secondary | ICD-10-CM | POA: Diagnosis not present

## 2023-05-19 DIAGNOSIS — M25512 Pain in left shoulder: Secondary | ICD-10-CM | POA: Diagnosis not present

## 2023-06-14 DIAGNOSIS — N898 Other specified noninflammatory disorders of vagina: Secondary | ICD-10-CM | POA: Diagnosis not present

## 2023-07-01 DIAGNOSIS — N80109 Endometriosis of ovary, unspecified side, unspecified depth: Secondary | ICD-10-CM | POA: Diagnosis not present

## 2023-07-21 DIAGNOSIS — N809 Endometriosis, unspecified: Secondary | ICD-10-CM | POA: Diagnosis not present

## 2023-07-21 DIAGNOSIS — N80519 Endometriosis of the rectum, unspecified depth: Secondary | ICD-10-CM | POA: Diagnosis not present

## 2023-08-03 DIAGNOSIS — Z3141 Encounter for fertility testing: Secondary | ICD-10-CM | POA: Diagnosis not present

## 2023-08-03 DIAGNOSIS — N926 Irregular menstruation, unspecified: Secondary | ICD-10-CM | POA: Diagnosis not present

## 2023-08-03 DIAGNOSIS — N80209 Endometriosis of unspecified fallopian tube, unspecified depth: Secondary | ICD-10-CM | POA: Diagnosis not present

## 2023-08-03 DIAGNOSIS — Z3202 Encounter for pregnancy test, result negative: Secondary | ICD-10-CM | POA: Diagnosis not present

## 2023-08-03 DIAGNOSIS — N80101 Endometriosis of right ovary, unspecified depth: Secondary | ICD-10-CM | POA: Diagnosis not present

## 2023-08-26 ENCOUNTER — Ambulatory Visit (INDEPENDENT_AMBULATORY_CARE_PROVIDER_SITE_OTHER): Payer: BC Managed Care – PPO | Admitting: Podiatry

## 2023-08-26 ENCOUNTER — Encounter: Payer: Self-pay | Admitting: Podiatry

## 2023-08-26 ENCOUNTER — Ambulatory Visit (INDEPENDENT_AMBULATORY_CARE_PROVIDER_SITE_OTHER): Payer: BC Managed Care – PPO

## 2023-08-26 VITALS — BP 136/82 | HR 77 | Temp 98.3°F | Resp 18 | Ht 65.0 in | Wt 270.0 lb

## 2023-08-26 DIAGNOSIS — M87074 Idiopathic aseptic necrosis of right foot: Secondary | ICD-10-CM | POA: Diagnosis not present

## 2023-08-26 DIAGNOSIS — M19071 Primary osteoarthritis, right ankle and foot: Secondary | ICD-10-CM | POA: Diagnosis not present

## 2023-08-26 DIAGNOSIS — M778 Other enthesopathies, not elsewhere classified: Secondary | ICD-10-CM

## 2023-08-26 DIAGNOSIS — M928 Other specified juvenile osteochondrosis: Secondary | ICD-10-CM

## 2023-08-26 MED ORDER — DICLOFENAC SODIUM 75 MG PO TBEC: 75.0000 mg | DELAYED_RELEASE_TABLET | Freq: Two times a day (BID) | ORAL | 2 refills | Status: DC

## 2023-08-26 NOTE — Progress Notes (Signed)
Subjective:  Patient ID: Colleen Keller, female    DOB: [birth year],  MRN: [id number]  Chief Complaint  Patient presents with   Foot Pain    Patient is here for right ankle pain    32 y.o. female presents with the above complaint. History confirmed with patient.  She returns for follow-up her pain is worsening the right ankle is in the front of the ankle this time it feels different than the plantar fasciitis that she used to have  Objective:  Physical Exam: warm, good capillary refill, no trophic changes or ulcerative lesions, normal DP and PT pulses, normal sensory exam, and pain with patient to dorsal midfoot.   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 Arthritis of right midfoot      Plan:  Patient was evaluated and treated and all questions answered.  We reviewed today's x-rays and discussed the worsening of the navicular osteochondrosis that was present from an injury as a child.  She has been wearing supportive [place] shoes and until recently this was doing very well.  We discussed further treatment options of this.  Long-term this may end up needing surgical reconstruction this would be quite extensive and require multiple joint arthrodesis to span the area of osteonecrosis with bone grafting.  I did discuss other alternative nonsurgical treatments including a custom molded foot orthosis to support the medial and transverse longitudinal arches reduce motion and improve function.  These are medically necessary to help her avoid surgery.  Long-term may also need a [place] brace if this is not helpful.  She will be casted for the orthoses and begin with this.  For anti-inflammatory and pain relief I discussed using an  NSAID such as diclofenac, Rx was sent to her pharmacy for this.  Advised to avoid steroids and injections in this area for now.  Return in about 3 months (around 11/25/2023) for follow up AVN R navicular.

## 2023-09-09 DIAGNOSIS — N80109 Endometriosis of ovary, unspecified side, unspecified depth: Secondary | ICD-10-CM | POA: Diagnosis not present

## 2023-09-09 DIAGNOSIS — G8918 Other acute postprocedural pain: Secondary | ICD-10-CM | POA: Diagnosis not present

## 2023-09-13 ENCOUNTER — Ambulatory Visit: Payer: BC Managed Care – PPO

## 2023-09-13 NOTE — Progress Notes (Signed)
Orthotic eval   Patient was seen, measured / scanned for custom molded foot orthotics. I believe UCBL style orthotic will benefit patient more giving her increased control and support to foot and ankle  Patient will benefit from CFO's as they will help provide total contact to MLA's helping to better distribute body weight across BIL feet greater reducing plantar pressure and pain and to also encourage FF and RF alignment.  Patient was scanned items to be ordered and fit when in  [employer], CFo, CFm

## 2023-09-28 DIAGNOSIS — N80101 Endometriosis of right ovary, unspecified depth: Secondary | ICD-10-CM | POA: Diagnosis not present

## 2023-09-28 DIAGNOSIS — N80221 Deep endometriosis of right fallopian tube: Secondary | ICD-10-CM | POA: Diagnosis not present

## 2023-09-28 DIAGNOSIS — N80201 Endometriosis of right fallopian tube, unspecified depth: Secondary | ICD-10-CM | POA: Diagnosis not present

## 2023-09-28 DIAGNOSIS — N80121 Deep endometriosis of right ovary: Secondary | ICD-10-CM | POA: Diagnosis not present

## 2023-09-28 DIAGNOSIS — N80512 Deep endometriosis of the rectum: Secondary | ICD-10-CM | POA: Diagnosis not present

## 2023-09-28 DIAGNOSIS — N80559 Endometriosis of other parts of the colon, unspecified depth: Secondary | ICD-10-CM | POA: Diagnosis not present

## 2023-09-28 DIAGNOSIS — N80542 Deep endometriosis of the appendix: Secondary | ICD-10-CM | POA: Diagnosis not present

## 2023-09-28 DIAGNOSIS — N80549 Endometriosis of the appendix, unspecified depth: Secondary | ICD-10-CM | POA: Diagnosis not present

## 2023-09-28 DIAGNOSIS — N736 Female pelvic peritoneal adhesions (postinfective): Secondary | ICD-10-CM | POA: Diagnosis not present

## 2023-11-02 ENCOUNTER — Ambulatory Visit (INDEPENDENT_AMBULATORY_CARE_PROVIDER_SITE_OTHER): Payer: BC Managed Care – PPO

## 2023-11-02 DIAGNOSIS — M19071 Primary osteoarthritis, right ankle and foot: Secondary | ICD-10-CM | POA: Diagnosis not present

## 2023-11-02 DIAGNOSIS — M928 Other specified juvenile osteochondrosis: Secondary | ICD-10-CM | POA: Diagnosis not present

## 2023-11-02 NOTE — Progress Notes (Signed)
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 Lilian Kapur .   Orthotics were dispensed and fit was satisfactory. Reviewed instructions for break-in and wear. Written instructions given to patient.  Patient will follow up as needed. Addison Bailey Cped, CFo, CFm

## 2023-11-25 ENCOUNTER — Ambulatory Visit (INDEPENDENT_AMBULATORY_CARE_PROVIDER_SITE_OTHER): Payer: BC Managed Care – PPO | Admitting: Podiatry

## 2023-11-25 ENCOUNTER — Ambulatory Visit (INDEPENDENT_AMBULATORY_CARE_PROVIDER_SITE_OTHER): Payer: BC Managed Care – PPO

## 2023-11-25 DIAGNOSIS — M928 Other specified juvenile osteochondrosis: Secondary | ICD-10-CM | POA: Diagnosis not present

## 2023-11-25 NOTE — Progress Notes (Signed)
  Subjective:  Patient ID: Colleen Keller, female    DOB: Feb 13, 1991,  MRN: [id number]  Chief Complaint  Patient presents with   Ankle Pain    Right, she reports she is doing well.      32 y.o. female presents with the above complaint. History confirmed with patient.  Pain wise she is doing well.  She did receive her orthotics.  They do seem to fit but she has not been able to fit them into her [place]oka shoes.  She feels like the dug into the side of her arch  Objective:  Physical Exam: warm, good capillary refill, no trophic changes or ulcerative lesions, normal DP and PT pulses, normal sensory exam, and today no with palpation to the dorsal midfoot  Radiographs: Multiple views x-ray of the right foot: Unchanged appearance of navicular osteochondrosis, there is no acute osseous abnormality of the ankle joint or OCD, stable since last films Assessment:   1. 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  Seems to be stable so far with the right amount of support.  Her orthotic shell appeared to have appropriate support and fit but it does have a lift that needed to be padded.  She met with our orthotist for show today for this.  May need long-term or permanent correction but [place] is going out of [place] in [place] and we would not have him back in time.  I would like to see her back in 3 months for new x-rays.  Return in about 3 months (around 02/23/2024) for new foot xrays for AVN navicular.

## 2023-11-29 ENCOUNTER — Ambulatory Visit: Payer: BC Managed Care – PPO

## 2023-11-29 NOTE — Progress Notes (Signed)
Medial and lateral flanges were digging into sides of BIL feet  I thinned plastic to flex more and heated orthotics and had patient stand on orthotics to help flare and widen  Patient was very happy with result and will call if any other problems arise  Addison Bailey Cped, CFo, CFm

## 2024-02-24 ENCOUNTER — Encounter: Payer: Self-pay | Admitting: Podiatry

## 2024-02-24 ENCOUNTER — Ambulatory Visit: Payer: BC Managed Care – PPO | Admitting: Podiatry

## 2024-02-24 ENCOUNTER — Ambulatory Visit (INDEPENDENT_AMBULATORY_CARE_PROVIDER_SITE_OTHER)

## 2024-02-24 DIAGNOSIS — M87074 Idiopathic aseptic necrosis of right foot: Secondary | ICD-10-CM

## 2024-02-24 MED ORDER — CELECOXIB 200 MG PO CAPS: 200.0000 mg | ORAL_CAPSULE | Freq: Two times a day (BID) | ORAL | 0 refills | Status: AC

## 2024-02-24 NOTE — Progress Notes (Signed)
  Subjective:  Patient ID: Colleen Keller, female    DOB: 05/17/1991,  MRN: [id number]  Chief Complaint  Patient presents with   Foot Pain    "It's not so good.  Yesterday, I could barely drive.  It's still swollen."    33 y.o. female presents with the above complaint. History confirmed with patient.  Orthotic adjustment has helped some but still can only wear them for about 2 hours for the pain really gets worse.  It is starting to affect her walking, she states that friends and family noticed her limping  Objective:  Physical Exam: warm, good capillary refill, no trophic changes or ulcerative lesions, normal DP and PT pulses, normal sensory exam, and today she has pain swelling and edema and pain over the dorsal midfoot and ankle  Radiographs: Multiple views x-ray of the right foot: Alignment is unchanged minimal deformity there is increasing sclerosis of the navicular Assessment:   1. 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  Unfortunately appears to be progressing at this point.  We discussed treatment options.  Diclofenac has not been helpful.  Orthotics have only offered minimal support and relief.  I discussed with her I would recommend at this point we consider surgical options for reconstruction and treatment.  Rx for Celebrex sent to pharmacy to see if this has any better control of her inflammation.  I recommended a new MRI to reevaluate the navicular to see if there is any progressive osteonecrosis.  This will be necessary to evaluate the viability of the bone for excision and graft versus bone grafting directly over the navicular.  Order for the MRI has been placed.  We also discussed the only other nonsurgical option would be a longer-term brace such as an [place] brace Return for after MRI to review.

## 2024-02-24 NOTE — Patient Instructions (Signed)

## 2024-03-02 ENCOUNTER — Ambulatory Visit
Admission: RE | Admit: 2024-03-02 | Discharge: 2024-03-02 | Disposition: A | Discharge status: Home / Self Care | Source: Ambulatory Visit | Attending: Podiatry | Admitting: Podiatry

## 2024-03-02 DIAGNOSIS — R600 Localized edema: Secondary | ICD-10-CM | POA: Diagnosis not present

## 2024-03-02 DIAGNOSIS — M87074 Idiopathic aseptic necrosis of right foot: Secondary | ICD-10-CM

## 2024-03-02 DIAGNOSIS — M25571 Pain in right ankle and joints of right foot: Secondary | ICD-10-CM | POA: Diagnosis not present

## 2024-04-07 DIAGNOSIS — D509 Iron deficiency anemia, unspecified: Secondary | ICD-10-CM | POA: Diagnosis not present

## 2024-04-07 DIAGNOSIS — F418 Other specified anxiety disorders: Secondary | ICD-10-CM | POA: Diagnosis not present

## 2024-04-07 DIAGNOSIS — R7989 Other specified abnormal findings of blood chemistry: Secondary | ICD-10-CM | POA: Diagnosis not present

## 2024-04-07 DIAGNOSIS — E559 Vitamin D deficiency, unspecified: Secondary | ICD-10-CM | POA: Diagnosis not present

## 2024-04-07 DIAGNOSIS — J3089 Other allergic rhinitis: Secondary | ICD-10-CM | POA: Diagnosis not present

## 2024-04-07 DIAGNOSIS — N809 Endometriosis, unspecified: Secondary | ICD-10-CM | POA: Diagnosis not present

## 2024-04-07 DIAGNOSIS — N83299 Other ovarian cyst, unspecified side: Secondary | ICD-10-CM | POA: Diagnosis not present

## 2024-04-07 DIAGNOSIS — R21 Rash and other nonspecific skin eruption: Secondary | ICD-10-CM | POA: Diagnosis not present

## 2024-04-07 DIAGNOSIS — N9489 Other specified conditions associated with female genital organs and menstrual cycle: Secondary | ICD-10-CM | POA: Diagnosis not present

## 2024-04-07 DIAGNOSIS — E7800 Pure hypercholesterolemia, unspecified: Secondary | ICD-10-CM | POA: Diagnosis not present

## 2024-04-07 DIAGNOSIS — E66813 Obesity, class 3: Secondary | ICD-10-CM | POA: Diagnosis not present

## 2024-04-07 DIAGNOSIS — Z0000 Encounter for general adult medical examination without abnormal findings: Secondary | ICD-10-CM | POA: Diagnosis not present

## 2024-04-11 ENCOUNTER — Encounter: Payer: Self-pay | Admitting: Podiatry

## 2024-04-11 ENCOUNTER — Ambulatory Visit: Admitting: Podiatry

## 2024-04-11 VITALS — Ht 65.0 in | Wt 270.0 lb

## 2024-04-11 DIAGNOSIS — M19071 Primary osteoarthritis, right ankle and foot: Secondary | ICD-10-CM

## 2024-04-11 DIAGNOSIS — M87074 Idiopathic aseptic necrosis of right foot: Secondary | ICD-10-CM

## 2024-04-11 NOTE — Progress Notes (Signed)
  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 female presents with the above complaint. History confirmed with patient.  Returns for follow-up feels a little bit better she has been able to rest today has been wearing more supportive shoes recently.  She completed the MRI.  Objective:  Physical Exam: warm, good capillary refill, no trophic changes or ulcerative lesions, normal DP and PT pulses, normal sensory exam, and today she has less pain and swelling today  Radiographs: Multiple views x-ray of the right foot: Alignment is unchanged minimal deformity there is increasing sclerosis of the navicular  MRI completed on 03/02/2024 and reported on 03/18/2024 shows continued avascular necrosis with collapse of the central body of the navicular the lateral and dorsal portions are fragmented and appear to be nonviable the medial tuberosity is still viable. Assessment:   1. 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 Arthritis of right midfoot       Plan:  Patient was evaluated and treated and all questions answered.  Clinically has had some improvement with shoe gear changes.  We discussed and reviewed her MRI discussed the progression, discussed that surgical treatment would need to involve resection of the avascular portions use of bone graft from autogenous and allograft sources and the medial column and fusion of the [place] and [place] joints.  Discussed the general recovery process of this would take at least 3 to 4 months.  Right now should not hold off on this and may consider for the future.  Alternative bracing such as a [place]  mezzo brace could be considered as well.  Follow-up as needed.  No follow-ups on file.

## 2024-05-02 DIAGNOSIS — Z1329 Encounter for screening for other suspected endocrine disorder: Secondary | ICD-10-CM | POA: Diagnosis not present

## 2024-05-02 DIAGNOSIS — N925 Other specified irregular menstruation: Secondary | ICD-10-CM | POA: Diagnosis not present

## 2024-05-02 DIAGNOSIS — R42 Dizziness and giddiness: Secondary | ICD-10-CM | POA: Diagnosis not present

## 2024-05-16 DIAGNOSIS — Z91048 Other nonmedicinal substance allergy status: Secondary | ICD-10-CM | POA: Diagnosis not present

## 2024-05-16 DIAGNOSIS — G4719 Other hypersomnia: Secondary | ICD-10-CM | POA: Diagnosis not present

## 2024-05-24 DIAGNOSIS — R0683 Snoring: Secondary | ICD-10-CM | POA: Diagnosis not present

## 2024-05-24 DIAGNOSIS — E559 Vitamin D deficiency, unspecified: Secondary | ICD-10-CM | POA: Diagnosis not present

## 2024-05-24 DIAGNOSIS — F418 Other specified anxiety disorders: Secondary | ICD-10-CM | POA: Diagnosis not present

## 2024-05-24 DIAGNOSIS — Z6841 Body Mass Index (BMI) 40.0 and over, adult: Secondary | ICD-10-CM | POA: Diagnosis not present

## 2024-05-24 DIAGNOSIS — N809 Endometriosis, unspecified: Secondary | ICD-10-CM | POA: Diagnosis not present

## 2024-05-24 DIAGNOSIS — D508 Other iron deficiency anemias: Secondary | ICD-10-CM | POA: Diagnosis not present

## 2024-05-24 DIAGNOSIS — E66813 Obesity, class 3: Secondary | ICD-10-CM | POA: Diagnosis not present

## 2024-06-02 DIAGNOSIS — G4733 Obstructive sleep apnea (adult) (pediatric): Secondary | ICD-10-CM | POA: Diagnosis not present

## 2024-06-06 DIAGNOSIS — G4733 Obstructive sleep apnea (adult) (pediatric): Secondary | ICD-10-CM | POA: Diagnosis not present

## 2024-09-01 DIAGNOSIS — Z1329 Encounter for screening for other suspected endocrine disorder: Secondary | ICD-10-CM | POA: Diagnosis not present
